# Patient Record
Sex: Male | Born: 1975
Health system: Southern US, Community
[De-identification: ages and names within clinical notes are randomized; demographics above are authoritative.]

## PROBLEM LIST (undated history)

## (undated) HISTORY — PX: KNEE SURGERY: SHX244

## (undated) HISTORY — PX: CHOLECYSTECTOMY: SHX55

---

## 2009-10-16 ENCOUNTER — Encounter: Admission: RE | Admit: 2009-10-16 | Discharge: 2009-10-16 | Payer: Self-pay | Admitting: Neurology

## 2009-10-23 ENCOUNTER — Encounter: Payer: Self-pay | Admitting: Endocrinology

## 2009-11-07 ENCOUNTER — Encounter: Payer: Self-pay | Admitting: Endocrinology

## 2010-04-29 ENCOUNTER — Encounter: Payer: Self-pay | Admitting: Neurology

## 2010-05-08 NOTE — Letter (Signed)
Summary: [facility]  [facility]   Imported By: Lester Rockwell 11/16/2009 07:25:19  _____________________________________________________________________  External Attachment:    Type:   Image     Comment:   External Document

## 2010-12-20 ENCOUNTER — Other Ambulatory Visit: Payer: Self-pay | Admitting: Family Medicine

## 2010-12-20 DIAGNOSIS — B360 Pityriasis versicolor: Secondary | ICD-10-CM

## 2010-12-20 MED ORDER — KETOCONAZOLE 200 MG PO TABS: ORAL_TABLET | ORAL | Status: DC

## 2012-08-07 ENCOUNTER — Other Ambulatory Visit: Payer: Self-pay | Admitting: Neurology

## 2012-08-07 DIAGNOSIS — G930 Cerebral cysts: Secondary | ICD-10-CM

## 2012-08-07 DIAGNOSIS — G35 Multiple sclerosis: Secondary | ICD-10-CM

## 2012-08-17 ENCOUNTER — Ambulatory Visit
Admission: RE | Admit: 2012-08-17 | Discharge: 2012-08-17 | Disposition: A | Discharge status: Home / Self Care | Payer: No Typology Code available for payment source | Source: Ambulatory Visit | Attending: Neurology | Admitting: Neurology

## 2012-08-17 DIAGNOSIS — G930 Cerebral cysts: Secondary | ICD-10-CM

## 2012-08-17 DIAGNOSIS — G35 Multiple sclerosis: Secondary | ICD-10-CM

## 2012-08-17 DIAGNOSIS — G35D Multiple sclerosis, unspecified: Secondary | ICD-10-CM

## 2012-08-17 MED ORDER — GADOBENATE DIMEGLUMINE 529 MG/ML IV SOLN
17.0000 mL | Freq: Once | INTRAVENOUS | Status: AC | PRN
  Administered 2012-08-17: 17 mL via INTRAVENOUS

## 2015-06-26 ENCOUNTER — Other Ambulatory Visit: Payer: Self-pay | Admitting: Family Medicine

## 2015-06-26 DIAGNOSIS — B360 Pityriasis versicolor: Secondary | ICD-10-CM

## 2015-06-26 MED ORDER — KETOCONAZOLE 200 MG PO TABS: ORAL_TABLET | ORAL | Status: DC

## 2016-05-27 ENCOUNTER — Other Ambulatory Visit: Payer: Self-pay | Admitting: Family Medicine

## 2016-05-27 DIAGNOSIS — B001 Herpesviral vesicular dermatitis: Secondary | ICD-10-CM

## 2016-05-27 MED ORDER — VALACYCLOVIR HCL 1 G PO TABS: 1000.0000 mg | ORAL_TABLET | Freq: Three times a day (TID) | ORAL | 5 refills | Status: DC

## 2016-10-15 ENCOUNTER — Other Ambulatory Visit: Payer: Self-pay | Admitting: Family Medicine

## 2016-10-15 DIAGNOSIS — B360 Pityriasis versicolor: Secondary | ICD-10-CM

## 2016-10-15 MED ORDER — KETOCONAZOLE 200 MG PO TABS
ORAL_TABLET | ORAL | 2 refills | Status: DC
Start: 2016-10-15 — End: 2017-05-20

## 2017-02-20 ENCOUNTER — Ambulatory Visit: Payer: PRIVATE HEALTH INSURANCE | Admitting: Family Medicine

## 2017-02-20 ENCOUNTER — Encounter: Payer: Self-pay | Admitting: Family Medicine

## 2017-02-20 DIAGNOSIS — R1084 Generalized abdominal pain: Secondary | ICD-10-CM

## 2017-02-20 NOTE — Patient Instructions (Signed)

## 2017-02-20 NOTE — Progress Notes (Signed)
Patient ID: Christopher Santana, male    DOB: 1975/07/10  Age: 41 y.o. MRN: [id number]    Subjective:  Subjective  HPI Christopher Santana presents for abd pain.  He was concerned about AP but pain has improved since a week ago.  He started taking psyllium about 1 week ago a has had several bms--- pt pain is almost completely gone.    Review of Systems  Constitutional: Negative for appetite change, diaphoresis, fatigue and unexpected weight change.  Eyes: Negative for pain, redness and visual disturbance.  Respiratory: Negative for cough, chest tightness, shortness of breath and wheezing.   Cardiovascular: Negative for chest pain, palpitations and leg swelling.  Endocrine: Negative for cold intolerance, heat intolerance, polydipsia, polyphagia and polyuria.  Genitourinary: Negative for difficulty urinating, dysuria and frequency.  Neurological: Negative for dizziness, light-headedness, numbness and headaches.    History History reviewed. No pertinent past medical history.  He has a past surgical history that includes Cholecystectomy.   His family history is not on file.He has no tobacco, alcohol, and drug history on file.  Current Outpatient Medications on File Prior to Visit  Medication Sig Dispense Refill  . ketoconazole (NIZORAL) 200 MG tablet As directed (Patient not taking: Reported on 02/20/2017) 7 tablet 2  . valACYclovir (VALTREX) 1000 MG tablet Take 1 tablet (1,000 mg total) by mouth 3 (three) times daily. (Patient not taking: Reported on 02/20/2017) 30 tablet 5   No current facility-administered medications on file prior to visit.      Objective:  Objective  Physical Exam  Constitutional: He is oriented to person, place, and time. Vital signs are normal. He appears well-developed and well-nourished. He is sleeping.  HENT:  Head: Normocephalic and atraumatic.  Mouth/Throat: Oropharynx is clear and moist.  Eyes: EOM are normal. Pupils are equal, round, and reactive to light.    Neck: Normal range of motion. Neck supple. No thyromegaly present.  Cardiovascular: Normal rate and regular rhythm.  No murmur heard. Pulmonary/Chest: Effort normal and breath sounds normal. No respiratory distress. He has no wheezes. He has no rales. He exhibits no tenderness.  Abdominal: Soft. Bowel sounds are normal. He exhibits no distension and no mass. There is no tenderness. There is no rebound and no guarding.  Musculoskeletal: He exhibits no edema or tenderness.  Neurological: He is alert and oriented to person, place, and time.  Skin: Skin is warm and dry.  Psychiatric: He has a normal mood and affect. His behavior is normal. Judgment and thought content normal.  Nursing note and vitals reviewed.  BP 132/87 (BP Location: Left Arm, Cuff Size: Large)   Pulse 63   Temp 97.9 F (36.6 C) (Oral)   Resp 16   Ht 5\' 10"  (1.778 m)   Wt 188 lb 6.4 oz (85.5 kg)   SpO2 98%   BMI 27.03 kg/m  Wt Readings from Last 3 Encounters:  02/20/17 188 lb 6.4 oz (85.5 kg)     No results found for: WBC, HGB, HCT, PLT, GLUCOSE, CHOL, TRIG, HDL, LDLDIRECT, LDLCALC, ALT, AST, NA, K, CL, CREATININE, BUN, CO2, TSH, PSA, INR, GLUF, HGBA1C, MICROALBUR  Mr Christopher Santana Wo Contrast  Result Date: 08/18/2012 *RADIOLOGY REPORT* Clinical Data: Multiple sclerosis. Arachnoid cysts. MRI HEAD WITHOUT AND WITH CONTRAST Technique:  Multiplanar, multiecho pulse sequences of the brain and surrounding structures were obtained according to standard protocol without and with intravenous contrast Contrast: 40mL MULTIHANCE GADOBENATE DIMEGLUMINE 529 MG/ML IV SOLN Comparison: MRI of the brain without and with contrast 10/16/2009.  Findings: A prominent arachnoid cyst of the left middle cranial fossa is stable.  An arachnoid cyst versus magnus cisterna magna is also stable.  Bilateral periventricular white matter lesions extend to the callosal septal margin.  There is no significant change. There is no enhancement or restricted  diffusion associated with these lesions.  Flow is present in the major intracranial arteries. Globes and orbits are intact.  Mild mucosal thickening is present in the ethmoid air cells bilaterally.  Polyps or mucous retention cysts are again noted at the floor of the left maxillary sinus. The postcontrast images demonstrate no pathologic enhancement. IMPRESSION: 1.  The no acute intracranial abnormality or significant interval change. 2.  Stable periventricular white matter lesions, compatible with a given diagnosis of multiple sclerosis. 3.  Stable left middle cranial fossa arachnoid cyst. 4.  Stable posterior fossa arachnoid cyst versus mega cisterna magna. 5.  Mild sinus disease. Original Report Authenticated By: Christopher Santana, M.D.     Assessment & Plan:  Plan  I am having Christopher RavensGeorge Santana maintain his valACYclovir and ketoconazole.  No orders of the defined types were placed in this encounter.   Problem List Items Addressed This Visit      Unprioritized   Abdominal pain    Resolved after BMs rto prn          Follow-up: Return if symptoms worsen or fail to improve.  Donato SchultzYvonne R Lowne Chase, DO

## 2017-02-23 ENCOUNTER — Encounter: Payer: Self-pay | Admitting: Family Medicine

## 2017-02-23 DIAGNOSIS — R109 Unspecified abdominal pain: Secondary | ICD-10-CM | POA: Insufficient documentation

## 2017-02-23 NOTE — Assessment & Plan Note (Signed)
Resolved after BMs rto prn

## 2017-03-04 ENCOUNTER — Other Ambulatory Visit: Payer: Self-pay | Admitting: Family Medicine

## 2017-03-04 DIAGNOSIS — R1084 Generalized abdominal pain: Secondary | ICD-10-CM

## 2017-03-07 ENCOUNTER — Ambulatory Visit: Payer: PRIVATE HEALTH INSURANCE | Admitting: Family Medicine

## 2017-03-07 ENCOUNTER — Ambulatory Visit (HOSPITAL_BASED_OUTPATIENT_CLINIC_OR_DEPARTMENT_OTHER)
Admission: RE | Admit: 2017-03-07 | Discharge: 2017-03-07 | Disposition: A | Discharge status: Home / Self Care | Payer: PRIVATE HEALTH INSURANCE | Source: Ambulatory Visit | Attending: Family Medicine | Admitting: Family Medicine

## 2017-03-07 ENCOUNTER — Other Ambulatory Visit: Payer: Self-pay

## 2017-03-07 ENCOUNTER — Encounter: Payer: Self-pay | Admitting: Family Medicine

## 2017-03-07 VITALS — BP 130/70 | HR 69 | Temp 98.0°F | Resp 16 | Ht 70.0 in | Wt 189.6 lb

## 2017-03-07 DIAGNOSIS — R1084 Generalized abdominal pain: Secondary | ICD-10-CM

## 2017-03-07 DIAGNOSIS — R1031 Right lower quadrant pain: Secondary | ICD-10-CM

## 2017-03-07 DIAGNOSIS — R109 Unspecified abdominal pain: Secondary | ICD-10-CM

## 2017-03-07 DIAGNOSIS — R1030 Lower abdominal pain, unspecified: Secondary | ICD-10-CM

## 2017-03-07 DIAGNOSIS — R609 Edema, unspecified: Secondary | ICD-10-CM | POA: Insufficient documentation

## 2017-03-07 LAB — POC URINALSYSI DIPSTICK (AUTOMATED)
BILIRUBIN UA: NEGATIVE
GLUCOSE UA: NEGATIVE
KETONES UA: NEGATIVE
Leukocytes, UA: NEGATIVE
Nitrite, UA: NEGATIVE
Protein, UA: NEGATIVE
RBC UA: NEGATIVE
UROBILINOGEN UA: 0.2 U/dL
pH, UA: 6 (ref 5.0–8.0)

## 2017-03-07 LAB — COMPREHENSIVE METABOLIC PANEL
ALT: 25 U/L (ref 0–53)
AST: 27 U/L (ref 0–37)
Albumin: 4.4 g/dL (ref 3.5–5.2)
Alkaline Phosphatase: 34 U/L — ABNORMAL LOW (ref 39–117)
BILIRUBIN TOTAL: 0.5 mg/dL (ref 0.2–1.2)
BUN: 13 mg/dL (ref 6–23)
CALCIUM: 9.6 mg/dL (ref 8.4–10.5)
CO2: 28 meq/L (ref 19–32)
CREATININE: 1.12 mg/dL (ref 0.40–1.50)
Chloride: 106 mEq/L (ref 96–112)
GFR: 75.95 mL/min (ref >60.00)
Glucose, Bld: 99 mg/dL (ref 70–99)
Potassium: 4.1 mEq/L (ref 3.5–5.1)
SODIUM: 142 meq/L (ref 135–145)
Total Protein: 7.4 g/dL (ref 6.0–8.3)

## 2017-03-07 LAB — CBC WITH DIFFERENTIAL/PLATELET
BASOS ABS: 0 10*3/uL (ref 0.0–0.1)
Basophils Relative: 1.3 % (ref 0.0–3.0)
EOS ABS: 0.2 10*3/uL (ref 0.0–0.7)
Eosinophils Relative: 4.7 % (ref 0.0–5.0)
HEMATOCRIT: 40.4 % (ref 39.0–52.0)
HEMOGLOBIN: 12.8 g/dL — AB (ref 13.0–17.0)
LYMPHS PCT: 42 % (ref 12.0–46.0)
Lymphs Abs: 1.7 10*3/uL (ref 0.7–4.0)
MCHC: 31.8 g/dL (ref 30.0–36.0)
MCV: 81.8 fl (ref 78.0–100.0)
MONO ABS: 0.3 10*3/uL (ref 0.1–1.0)
Monocytes Relative: 8.5 % (ref 3.0–12.0)
Neutro Abs: 1.7 10*3/uL (ref 1.4–7.7)
Neutrophils Relative %: 43.5 % (ref 43.0–77.0)
PLATELETS: 260 10*3/uL (ref 150.0–400.0)
RBC: 4.94 Mil/uL (ref 4.22–5.81)
RDW: 14.9 % (ref 11.5–15.5)
WBC: 4 10*3/uL (ref 4.0–10.5)

## 2017-03-07 MED ORDER — IOPAMIDOL (ISOVUE-300) INJECTION 61%
100.0000 mL | Freq: Once | INTRAVENOUS | Status: AC | PRN
  Administered 2017-03-07: 100 mL via INTRAVENOUS

## 2017-03-07 NOTE — Progress Notes (Signed)
Patient ID: Christopher Santana, male    DOB: 06-Jun-1975  Age: 41 y.o. MRN: [id number]    Subjective:  Subjective  HPI Christopher Santana presents for worsening rlq pain over the last several days.  He was double over in pain yesterday.  No constipation, no nvd.  No fever.  Pt also c/o R Upper rib pain with deep breath which makes abd pain worse as well.  Pt has pain with walking , and has to lay flat for any relief.  Any movement hurts.    Review of Systems  Constitutional: Negative for appetite change, diaphoresis, fatigue and unexpected weight change.  Eyes: Negative for pain, redness and visual disturbance.  Respiratory: Negative for cough, chest tightness, shortness of breath and wheezing.   Cardiovascular: Negative for chest pain, palpitations and leg swelling.  Gastrointestinal: Positive for abdominal distention and abdominal pain. Negative for constipation, diarrhea, nausea, rectal pain and vomiting.  Endocrine: Negative for cold intolerance, heat intolerance, polydipsia, polyphagia and polyuria.  Genitourinary: Negative for difficulty urinating, dysuria and frequency.  Neurological: Negative for dizziness, light-headedness, numbness and headaches.    History No past medical history on file.  He has a past surgical history that includes Cholecystectomy and Knee surgery (Left).   His family history is not on file.He reports that  has never smoked. he has never used smokeless tobacco. He reports that he drinks alcohol. He reports that he does not use drugs.  Current Outpatient Medications on File Prior to Visit  Medication Sig Dispense Refill  . ketoconazole (NIZORAL) 200 MG tablet As directed (Patient not taking: Reported on 02/20/2017) 7 tablet 2  . valACYclovir (VALTREX) 1000 MG tablet Take 1 tablet (1,000 mg total) by mouth 3 (three) times daily. (Patient not taking: Reported on 02/20/2017) 30 tablet 5   No current facility-administered medications on file prior to visit.        Objective:  Objective  Physical Exam  Constitutional: He is oriented to person, place, and time. Vital signs are normal. He appears well-developed and well-nourished. He is sleeping.  HENT:  Head: Normocephalic and atraumatic.  Mouth/Throat: Oropharynx is clear and moist.  Eyes: EOM are normal. Pupils are equal, round, and reactive to light.  Neck: Normal range of motion. Neck supple. No thyromegaly present.  Cardiovascular: Normal rate and regular rhythm.  No murmur heard. Pulmonary/Chest: Effort normal and breath sounds normal. No respiratory distress. He has no wheezes. He has no rales. He exhibits tenderness. He exhibits no crepitus.    Abdominal: He exhibits no mass. There is tenderness in the right lower quadrant. There is guarding. There is no rebound.    Musculoskeletal: He exhibits no edema or tenderness.  Neurological: He is alert and oriented to person, place, and time.  Skin: Skin is warm and dry.  Psychiatric: He has a normal mood and affect. His behavior is normal. Judgment and thought content normal.  Nursing note and vitals reviewed.  BP 130/70 (BP Location: Left Arm, Cuff Size: Normal)   Pulse 69   Temp 98 F (36.7 C) (Oral)   Resp 16   Ht 5\' 10"  (1.778 m)   Wt 189 lb 9.6 oz (86 kg)   SpO2 97%   BMI 27.20 kg/m  Wt Readings from Last 3 Encounters:  03/07/17 189 lb 9.6 oz (86 kg)  02/20/17 188 lb 6.4 oz (85.5 kg)     No results found for: WBC, HGB, HCT, PLT, GLUCOSE, CHOL, TRIG, HDL, LDLDIRECT, LDLCALC, ALT, AST, NA, K, CL, CREATININE,  BUN, CO2, TSH, PSA, INR, GLUF, Christopher Santana  Mr Christopher Santana Contrast  Result Date: 08/18/2012 *RADIOLOGY REPORT* Clinical Data: Multiple sclerosis. Arachnoid cysts. MRI HEAD WITHOUT AND WITH CONTRAST Technique:  Multiplanar, multiecho pulse sequences of the brain and surrounding structures were obtained according to standard protocol without and with intravenous contrast Contrast: 62mL MULTIHANCE GADOBENATE DIMEGLUMINE  529 MG/ML IV SOLN Comparison: MRI of the brain without and with contrast 10/16/2009. Findings: A prominent arachnoid cyst of the left middle cranial fossa is stable.  An arachnoid cyst versus magnus cisterna magna is also stable.  Bilateral periventricular white matter lesions extend to the callosal septal margin.  There is no significant change. There is no enhancement or restricted diffusion associated with these lesions.  Flow is present in the major intracranial arteries. Globes and orbits are intact.  Mild mucosal thickening is present in the ethmoid air cells bilaterally.  Polyps or mucous retention cysts are again noted at the floor of the left maxillary sinus. The postcontrast images demonstrate no pathologic enhancement. IMPRESSION: 1.  The no acute intracranial abnormality or significant interval change. 2.  Stable periventricular white matter lesions, compatible with a given diagnosis of multiple sclerosis. 3.  Stable left middle cranial fossa arachnoid cyst. 4.  Stable posterior fossa arachnoid cyst versus mega cisterna magna. 5.  Mild sinus disease. Original Report Authenticated By: Marin Roberts, M.D.     Assessment & Plan:  Plan  I am having Christopher Santana maintain his valACYclovir and ketoconazole.  No orders of the defined types were placed in this encounter.   Problem List Items Addressed This Visit      Unprioritized   Abdominal pain - Primary   Relevant Orders   CBC with Differential/Platelet   Comprehensive metabolic panel   POCT Urinalysis Dipstick (Automated)   DG Ribs Unilateral W/Chest Right   CT Abdomen Pelvis W Contrast    labs ordered Ct ordered to r/o appendicitis ---- pt being sent down to ct Pt to go to ER if pain worsens  Follow-up: Return if symptoms worsen or fail to improve.  Donato Schultz, DO

## 2017-03-07 NOTE — Progress Notes (Deleted)
Patient ID: Christopher Santana, male   DOB: September 27, 1975, 41 y.o.   MRN: [id number]     Subjective:  {Insert Teamcare-CMA Attestation HERE}  Patient ID: Christopher Santana, male    DOB: 11-Jul-1975, 41 y.o.   MRN: [id number]  Chief Complaint  Patient presents with  . Abdominal Pain    HPI  Patient is in today for abdominal pain.  Patient Care Team: Patient, No Pcp Per as PCP - General (General Practice)   No past medical history on file.  Past Surgical History:  Procedure Laterality Date  . CHOLECYSTECTOMY      No family history on file.  Social History   Socioeconomic History  . Marital status: Married    Spouse name: Not on file  . Number of children: Not on file  . Years of education: Not on file  . Highest education level: Not on file  Social Needs  . Financial resource strain: Not on file  . Food insecurity - worry: Not on file  . Food insecurity - inability: Not on file  . Transportation needs - medical: Not on file  . Transportation needs - non-medical: Not on file  Occupational History  . Not on file  Tobacco Use  . Smoking status: Never Smoker  . Smokeless tobacco: Never Used  Substance and Sexual Activity  . Alcohol use: Yes  . Drug use: No  . Sexual activity: Yes    Partners: Female  Other Topics Concern  . Not on file  Social History Narrative  . Not on file    Outpatient Medications Prior to Visit  Medication Sig Dispense Refill  . ketoconazole (NIZORAL) 200 MG tablet As directed (Patient not taking: Reported on 02/20/2017) 7 tablet 2  . valACYclovir (VALTREX) 1000 MG tablet Take 1 tablet (1,000 mg total) by mouth 3 (three) times daily. (Patient not taking: Reported on 02/20/2017) 30 tablet 5   No facility-administered medications prior to visit.     Not on File  Review of Systems  Constitutional: Negative for fever and malaise/fatigue.  HENT: Negative for congestion.   Eyes: Negative for blurred vision.  Respiratory: Negative for cough and  shortness of breath.   Cardiovascular: Negative for chest pain, palpitations and leg swelling.  Gastrointestinal: Positive for abdominal pain (whole right side). Negative for nausea and vomiting.  Musculoskeletal: Negative for back pain.  Skin: Negative for rash.  Neurological: Negative for loss of consciousness and headaches.       Objective:    Physical Exam  BP 130/70 (BP Location: Left Arm, Cuff Size: Normal)   Pulse 69   Temp 98 F (36.7 C) (Oral)   Resp 16   Ht 5' 10"  (1.778 m)   Wt 189 lb 9.6 oz (86 kg)   SpO2 97%   BMI 27.20 kg/m  Wt Readings from Last 3 Encounters:  03/07/17 189 lb 9.6 oz (86 kg)  02/20/17 188 lb 6.4 oz (85.5 kg)   BP Readings from Last 3 Encounters:  03/07/17 130/70  02/20/17 132/87      There is no immunization history on file for this patient.  Health Maintenance  Topic Date Due  . HIV Screening  11/20/1988  . TETANUS/TDAP  11/20/1992  . INFLUENZA VACCINE  11/06/2016    No results found for: WBC, HGB, HCT, PLT, GLUCOSE, CHOL, TRIG, HDL, LDLDIRECT, LDLCALC, ALT, AST, NA, K, CL, CREATININE, BUN, CO2, TSH, PSA, INR, GLUF, HGBA1C, MICROALBUR  No results found for: TSH No results found for: WBC, HGB, HCT,  MCV, PLT No results found for: NA, K, CHLORIDE, CO2, GLUCOSE, BUN, CREATININE, BILITOT, ALKPHOS, AST, ALT, PROT, ALBUMIN, CALCIUM, ANIONGAP, EGFR, GFR No results found for: CHOL No results found for: HDL No results found for: LDLCALC No results found for: TRIG No results found for: CHOLHDL No results found for: HGBA1C       Assessment & Plan:   Problem List Items Addressed This Visit    None      I am having Christopher Santana maintain his valACYclovir and ketoconazole.  No orders of the defined types were placed in this encounter.   {PROVIDER TO DELETE} Jerene Dilling, CMA

## 2017-03-07 NOTE — Patient Instructions (Signed)

## 2017-03-10 ENCOUNTER — Encounter: Payer: Self-pay | Admitting: *Deleted

## 2017-04-23 ENCOUNTER — Ambulatory Visit (HOSPITAL_COMMUNITY)
Admission: RE | Admit: 2017-04-23 | Discharge: 2017-04-23 | Disposition: A | Discharge status: Home / Self Care | Payer: PRIVATE HEALTH INSURANCE | Source: Ambulatory Visit | Attending: Family Medicine | Admitting: Family Medicine

## 2017-04-23 DIAGNOSIS — R109 Unspecified abdominal pain: Secondary | ICD-10-CM | POA: Insufficient documentation

## 2017-04-23 DIAGNOSIS — Z9049 Acquired absence of other specified parts of digestive tract: Secondary | ICD-10-CM | POA: Diagnosis not present

## 2017-04-23 MED ORDER — GADOBENATE DIMEGLUMINE 529 MG/ML IV SOLN
20.0000 mL | Freq: Once | INTRAVENOUS | Status: AC | PRN
  Administered 2017-04-23: 18 mL via INTRAVENOUS

## 2017-04-26 ENCOUNTER — Ambulatory Visit (HOSPITAL_BASED_OUTPATIENT_CLINIC_OR_DEPARTMENT_OTHER): Payer: PRIVATE HEALTH INSURANCE

## 2017-04-29 NOTE — Progress Notes (Signed)
Results given to patient over the phone 

## 2017-05-19 ENCOUNTER — Encounter: Payer: Self-pay | Admitting: Family Medicine

## 2017-05-19 MED ORDER — CEPHALEXIN 500 MG PO CAPS: 500.0000 mg | ORAL_CAPSULE | Freq: Two times a day (BID) | ORAL | 0 refills | Status: DC

## 2017-05-19 NOTE — Telephone Encounter (Signed)
Infected--- keflex 500 mg bid x 10 days  Ov later this week  Can use neosporin also topically Looks like he needs tdap as well

## 2017-05-19 NOTE — Telephone Encounter (Signed)
Looks infected ----- keflex 500 mg bidx 10 days Can use neosporin topically F/u later this week

## 2017-05-20 ENCOUNTER — Encounter: Payer: Self-pay | Admitting: Family Medicine

## 2017-05-20 ENCOUNTER — Ambulatory Visit: Payer: PRIVATE HEALTH INSURANCE | Admitting: Family Medicine

## 2017-05-20 VITALS — BP 138/88 | HR 64 | Temp 97.6°F | Resp 16 | Ht 70.0 in | Wt 198.4 lb

## 2017-05-20 DIAGNOSIS — L03115 Cellulitis of right lower limb: Secondary | ICD-10-CM

## 2017-05-20 DIAGNOSIS — Z23 Encounter for immunization: Secondary | ICD-10-CM | POA: Diagnosis not present

## 2017-05-20 DIAGNOSIS — L02415 Cutaneous abscess of right lower limb: Secondary | ICD-10-CM | POA: Diagnosis not present

## 2017-05-20 NOTE — Progress Notes (Signed)
Subjective:    Patient ID: Christopher Santana, male    DOB: May 13, 1975, 42 y.o.   MRN: [id number]  Chief Complaint  Patient presents with  . Wound Check    HPI Patient is in today for c/o wound R shin -- [place] last week.  It has gotten swollen and red.  No calf pain.  Pt started abx yesterday.  See my chart message.    No past medical history on file.  Past Surgical History:  Procedure Laterality Date  . CHOLECYSTECTOMY    . KNEE SURGERY Left    around 2011    No family history on file.  Social History   Socioeconomic History  . Marital status: Married    Spouse name: Not on file  . Number of children: Not on file  . Years of education: Not on file  . Highest education level: Not on file  Social Needs  . Financial resource strain: Not on file  . Food insecurity - worry: Not on file  . Food insecurity - inability: Not on file  . Transportation needs - medical: Not on file  . Transportation needs - non-medical: Not on file  Occupational History  . Not on file  Tobacco Use  . Smoking status: Never Smoker  . Smokeless tobacco: Never Used  Substance and Sexual Activity  . Alcohol use: Yes  . Drug use: No  . Sexual activity: Yes    Partners: Female  Other Topics Concern  . Not on file  Social History Narrative  . Not on file    Outpatient Medications Prior to Visit  Medication Sig Dispense Refill  . cephALEXin (KEFLEX) 500 MG capsule Take 1 capsule (500 mg total) by mouth 2 (two) times daily. For 10 days 20 capsule 0  . ketoconazole (NIZORAL) 200 MG tablet As directed (Patient not taking: Reported on 02/20/2017) 7 tablet 2  . valACYclovir (VALTREX) 1000 MG tablet Take 1 tablet (1,000 mg total) by mouth 3 (three) times daily. (Patient not taking: Reported on 02/20/2017) 30 tablet 5   No facility-administered medications prior to visit.     Not on File  Review of Systems  Constitutional: Negative for chills, fever and malaise/fatigue.    HENT: Negative for congestion and hearing loss.   Eyes: Negative for discharge.  Respiratory: Negative for cough, sputum production and shortness of breath.   Cardiovascular: Negative for chest pain, palpitations and leg swelling.  Gastrointestinal: Negative for abdominal pain, blood in stool, constipation, diarrhea, heartburn, nausea and vomiting.  Genitourinary: Negative for dysuria, frequency, hematuria and urgency.  Musculoskeletal: Negative for back pain, falls and myalgias.  Skin: Negative for rash.  Neurological: Negative for dizziness, sensory change, loss of consciousness, weakness and headaches.  Endo/Heme/Allergies: Negative for environmental allergies. Does not bruise/bleed easily.  Psychiatric/Behavioral: Negative for depression and suicidal ideas. The patient is not nervous/anxious and does not have insomnia.        Objective:    Physical Exam  Skin: There is erythema.     Nursing note and vitals reviewed.   BP 138/88 (BP Location: Left Arm, Cuff Size: Normal)   Pulse 64   Temp 97.6 F (36.4 C) (Oral)   Resp 16   Ht 5\' 10"  (1.778 m)   Wt 198 lb 6.4 oz (90 kg)   SpO2 97%   BMI 28.47 kg/m  Wt Readings from Last 3 Encounters:  05/20/17 198 lb 6.4 oz (90 kg)  04/23/17 189 lb (  85.7 kg)  03/07/17 189 lb 9.6 oz (86 kg)     Lab Results  Component Value Date   WBC 4.0 03/07/2017   HGB 12.8 (L) 03/07/2017   HCT 40.4 03/07/2017   PLT 260.0 03/07/2017   GLUCOSE 99 03/07/2017   ALT 25 03/07/2017   AST 27 03/07/2017   NA 142 03/07/2017   K 4.1 03/07/2017   CL 106 03/07/2017   CREATININE 1.12 03/07/2017   BUN 13 03/07/2017   CO2 28 03/07/2017    No results found for: TSH Lab Results  Component Value Date   WBC 4.0 03/07/2017   HGB 12.8 (L) 03/07/2017   HCT 40.4 03/07/2017   MCV 81.8 03/07/2017   PLT 260.0 03/07/2017   Lab Results  Component Value Date   NA 142 03/07/2017   K 4.1 03/07/2017   CO2 28 03/07/2017   GLUCOSE 99 03/07/2017   BUN 13  03/07/2017   CREATININE 1.12 03/07/2017   BILITOT 0.5 03/07/2017   ALKPHOS 34 (L) 03/07/2017   AST 27 03/07/2017   ALT 25 03/07/2017   PROT 7.4 03/07/2017   ALBUMIN 4.4 03/07/2017   CALCIUM 9.6 03/07/2017   GFR 75.95 03/07/2017   No results found for: CHOL No results found for: HDL No results found for: LDLCALC No results found for: TRIG No results found for: CHOLHDL No results found for: [id number]     Assessment & Plan:   Problem List Items Addressed This Visit      Unprioritized   Cellulitis and abscess of right leg    Finish keflex Pt states it has improved over night Keep elevated tdap given today F/u prn       Other Visit Diagnoses    Need for Tdap vaccination    -  Primary   Relevant Orders   Tdap vaccine greater than or equal to 7yo IM (Completed)      I have discontinued Greggory Stallion Olden's valACYclovir and ketoconazole. I am also having him maintain his cephALEXin.  No orders of the defined types were placed in this encounter.  CMA served as Neurosurgeon during this visit. History, Physical and Plan performed by medical provider. Documentation and orders reviewed and attested to.   Donato Schultz, DO

## 2017-05-20 NOTE — Patient Instructions (Signed)

## 2017-05-20 NOTE — Assessment & Plan Note (Signed)
Finish keflex Pt states it has improved over night Keep elevated tdap given today F/u prn

## 2017-05-27 ENCOUNTER — Encounter: Payer: Self-pay | Admitting: Family Medicine

## 2017-08-21 ENCOUNTER — Telehealth: Payer: Self-pay | Admitting: Family Medicine

## 2017-08-21 ENCOUNTER — Other Ambulatory Visit: Payer: Self-pay | Admitting: Family Medicine

## 2017-08-21 DIAGNOSIS — M25511 Pain in right shoulder: Principal | ICD-10-CM

## 2017-08-21 DIAGNOSIS — G8929 Other chronic pain: Secondary | ICD-10-CM

## 2017-08-21 MED ORDER — PREDNISONE 10 MG PO TABS: ORAL_TABLET | ORAL | 0 refills | Status: DC

## 2017-08-21 MED ORDER — CYCLOBENZAPRINE HCL 10 MG PO TABS: 10.0000 mg | ORAL_TABLET | Freq: Three times a day (TID) | ORAL | 0 refills | Status: DC | PRN

## 2017-08-21 NOTE — Telephone Encounter (Signed)
Pt is having shoulder pain---  Had an injection for bursitis with Dr Ethelene Hal last year for bursitis-- it helped a little If that did not work he was going to inject for tendinitis --- he leaves for [place] soon and will not be able to get into [place] Wanting something to help since he is leaving town Will call in pred taper and flexeril for him and he may see Dr Ethelene Hal when he gets back if no better

## 2017-08-28 ENCOUNTER — Other Ambulatory Visit: Payer: Self-pay | Admitting: Family Medicine

## 2017-08-28 DIAGNOSIS — E538 Deficiency of other specified B group vitamins: Secondary | ICD-10-CM

## 2017-08-28 MED ORDER — CYANOCOBALAMIN 1000 MCG/ML IJ SOLN: 1000.0000 ug | INTRAMUSCULAR | 3 refills | Status: DC

## 2017-09-26 ENCOUNTER — Ambulatory Visit: Payer: No Typology Code available for payment source | Admitting: Family Medicine

## 2017-09-26 ENCOUNTER — Encounter: Payer: Self-pay | Admitting: Family Medicine

## 2017-09-26 VITALS — BP 118/76 | HR 80 | Temp 98.3°F | Ht 69.0 in | Wt 187.4 lb

## 2017-09-26 DIAGNOSIS — M25511 Pain in right shoulder: Secondary | ICD-10-CM

## 2017-09-26 DIAGNOSIS — G8929 Other chronic pain: Secondary | ICD-10-CM | POA: Diagnosis not present

## 2017-09-26 MED ORDER — METHYLPREDNISOLONE ACETATE 80 MG/ML IJ SUSP
80.0000 mg | Freq: Once | INTRAMUSCULAR | Status: AC
  Administered 2017-09-26: 80 mg via INTRAMUSCULAR

## 2017-09-26 NOTE — Progress Notes (Addendum)
Chief Complaint  Patient presents with  . Shoulder Pain    right    Christopher Santana is a 42 yo male here for a shoulder inj. Has been dealing with some chronic issues on R shoulder. Pred rx'd by pcp really helped. Today is a good day, but has issues with shoulder much of time.   Gen- awake, alert MSK- no ttp, +Neer's, Hawkins, emptycan Psych- age appropriate judgment and insight  Procedure Note; Shoulder bursa injection Informed consent obtained. The area was palpated, an area was marked just caudal to the acromion process laterally, and cleaned with alcohol x1. A 27-gauge needle was used to enter the joint laterally with ease. 80 mg of Depomedrol with 2 mL of 1% lidocaine was injected. The patient tolerated the procedure well. There were no complications noted.  Chronic right shoulder pain - Plan: PR DRAIN/INJECT LARGE JOINT/BURSA  Discussed follow up care.  If this does not help, might need to consider MRI vs seeing ortho again. F/u as originally scheduled with pcp. Pt voiced understanding and agreement to the plan.  Jilda Roche Malaquias Lenker 1:04 PM 09/26/17

## 2017-09-26 NOTE — Addendum Note (Signed)
Addended by: Scharlene Gloss B on: 09/26/2017 01:21 PM   Modules accepted: Orders

## 2018-10-01 ENCOUNTER — Telehealth: Payer: Self-pay | Admitting: Family Medicine

## 2018-10-01 ENCOUNTER — Telehealth: Payer: Self-pay | Admitting: *Deleted

## 2018-10-01 DIAGNOSIS — Z20822 Contact with and (suspected) exposure to covid-19: Secondary | ICD-10-CM

## 2018-10-01 NOTE — Telephone Encounter (Signed)
Pt scheduled for covid testing on 10/02/18 @ 12:15pm. Instructions given and order placed.

## 2018-10-01 NOTE — Telephone Encounter (Signed)
Pt was exposed to +covid and needs testing--- no symptoms

## 2018-10-02 ENCOUNTER — Other Ambulatory Visit: Payer: Self-pay

## 2018-10-02 DIAGNOSIS — Z20822 Contact with and (suspected) exposure to covid-19: Secondary | ICD-10-CM

## 2018-10-08 LAB — NOVEL CORONAVIRUS, NAA: SARS-CoV-2, NAA: NOT DETECTED

## 2019-06-18 DIAGNOSIS — R5383 Other fatigue: Secondary | ICD-10-CM | POA: Diagnosis not present

## 2019-06-18 DIAGNOSIS — R519 Headache, unspecified: Secondary | ICD-10-CM | POA: Diagnosis not present

## 2019-06-18 DIAGNOSIS — Z6826 Body mass index (BMI) 26.0-26.9, adult: Secondary | ICD-10-CM | POA: Diagnosis not present

## 2019-06-24 ENCOUNTER — Ambulatory Visit: Payer: No Typology Code available for payment source

## 2019-07-07 ENCOUNTER — Other Ambulatory Visit: Payer: Self-pay

## 2019-07-08 ENCOUNTER — Ambulatory Visit: Payer: BC Managed Care – PPO | Admitting: Family Medicine

## 2019-07-08 ENCOUNTER — Other Ambulatory Visit: Payer: Self-pay

## 2019-07-08 ENCOUNTER — Encounter: Payer: Self-pay | Admitting: Family Medicine

## 2019-07-08 VITALS — BP 134/98 | HR 94 | Temp 97.0°F | Resp 18 | Ht 69.0 in | Wt 193.0 lb

## 2019-07-08 DIAGNOSIS — F430 Acute stress reaction: Secondary | ICD-10-CM

## 2019-07-08 DIAGNOSIS — M5030 Other cervical disc degeneration, unspecified cervical region: Secondary | ICD-10-CM | POA: Diagnosis not present

## 2019-07-08 DIAGNOSIS — G35 Multiple sclerosis: Secondary | ICD-10-CM | POA: Diagnosis not present

## 2019-07-08 DIAGNOSIS — R030 Elevated blood-pressure reading, without diagnosis of hypertension: Secondary | ICD-10-CM

## 2019-07-08 DIAGNOSIS — S80861A Insect bite (nonvenomous), right lower leg, initial encounter: Secondary | ICD-10-CM

## 2019-07-08 DIAGNOSIS — W57XXXA Bitten or stung by nonvenomous insect and other nonvenomous arthropods, initial encounter: Secondary | ICD-10-CM

## 2019-07-08 LAB — CBC WITH DIFFERENTIAL/PLATELET
Basophils Absolute: 0 10*3/uL (ref 0.0–0.1)
Basophils Relative: 1.1 % (ref 0.0–3.0)
Eosinophils Absolute: 0.1 10*3/uL (ref 0.0–0.7)
Eosinophils Relative: 2.7 % (ref 0.0–5.0)
HCT: 40.5 % (ref 39.0–52.0)
Hemoglobin: 13.2 g/dL (ref 13.0–17.0)
Lymphocytes Relative: 37.5 % (ref 12.0–46.0)
Lymphs Abs: 1.6 10*3/uL (ref 0.7–4.0)
MCHC: 32.6 g/dL (ref 30.0–36.0)
MCV: 79 fl (ref 78.0–100.0)
Monocytes Absolute: 0.3 10*3/uL (ref 0.1–1.0)
Monocytes Relative: 6.7 % (ref 3.0–12.0)
Neutro Abs: 2.2 10*3/uL (ref 1.4–7.7)
Neutrophils Relative %: 52 % (ref 43.0–77.0)
Platelets: 223 10*3/uL (ref 150.0–400.0)
RBC: 5.13 Mil/uL (ref 4.22–5.81)
RDW: 14.8 % (ref 11.5–15.5)
WBC: 4.2 10*3/uL (ref 4.0–10.5)

## 2019-07-08 LAB — COMPREHENSIVE METABOLIC PANEL
ALT: 39 U/L (ref 0–53)
AST: 31 U/L (ref 0–37)
Albumin: 4.7 g/dL (ref 3.5–5.2)
Alkaline Phosphatase: 41 U/L (ref 39–117)
BUN: 16 mg/dL (ref 6–23)
CO2: 28 mEq/L (ref 19–32)
Calcium: 9.5 mg/dL (ref 8.4–10.5)
Chloride: 103 mEq/L (ref 96–112)
Creatinine, Ser: 1.01 mg/dL (ref 0.40–1.50)
GFR: 79.66 mL/min (ref >60.00)
Glucose, Bld: 82 mg/dL (ref 70–99)
Potassium: 4.5 mEq/L (ref 3.5–5.1)
Sodium: 138 mEq/L (ref 135–145)
Total Bilirubin: 0.5 mg/dL (ref 0.2–1.2)
Total Protein: 7.2 g/dL (ref 6.0–8.3)

## 2019-07-08 LAB — VITAMIN B12: Vitamin B-12: 330 pg/mL (ref 211–911)

## 2019-07-08 LAB — TSH: TSH: 1.02 u[IU]/mL (ref 0.35–4.50)

## 2019-07-08 MED ORDER — PREDNISONE 10 MG PO TABS: ORAL_TABLET | ORAL | 0 refills | Status: DC

## 2019-07-08 MED ORDER — DOXYCYCLINE HYCLATE 100 MG PO TABS: 100.0000 mg | ORAL_TABLET | Freq: Two times a day (BID) | ORAL | 0 refills | Status: DC

## 2019-07-08 NOTE — Patient Instructions (Signed)

## 2019-07-08 NOTE — Progress Notes (Signed)
Patient ID: Christopher Santana, male    DOB: January 09, 1976  Age: 44 y.o. MRN: [id number]    Subjective:  Subjective  HPI Christopher Santana presents for tick bite behind R knee 2 weeks ago-- no pain but he has tingling in both legs    He was dx MS years ago but was in remission.  He is a little worried this is coming back.  He has not seen neuro since his last neuro retired a few years ago.    Review of Systems  Constitutional: Negative for appetite change, diaphoresis, fatigue and unexpected weight change.  Eyes: Negative for pain, redness and visual disturbance.  Respiratory: Negative for cough, chest tightness, shortness of breath and wheezing.   Cardiovascular: Negative for chest pain, palpitations and leg swelling.  Endocrine: Negative for cold intolerance, heat intolerance, polydipsia, polyphagia and polyuria.  Genitourinary: Negative for difficulty urinating, dysuria and frequency.  Neurological: Positive for numbness. Negative for dizziness, light-headedness and headaches.    History No past medical history on file.  He has a past surgical history that includes Cholecystectomy and Knee surgery (Left).   His family history is not on file.He reports that he has never smoked. He has never used smokeless tobacco. He reports current alcohol use. He reports that he does not use drugs.  No current outpatient medications on file prior to visit.   No current facility-administered medications on file prior to visit.     Objective:  Objective  Physical Exam Vitals and nursing note reviewed.  Constitutional:      General: He is sleeping.     Appearance: He is well-developed.  HENT:     Head: Normocephalic and atraumatic.  Eyes:     Pupils: Pupils are equal, round, and reactive to light.  Neck:     Thyroid: No thyromegaly.  Cardiovascular:     Rate and Rhythm: Normal rate and regular rhythm.     Heart sounds: No murmur.  Pulmonary:     Effort: Pulmonary effort is normal. No respiratory  distress.     Breath sounds: Normal breath sounds. No wheezing or rales.  Chest:     Chest wall: No tenderness.  Musculoskeletal:        General: No tenderness.     Cervical back: Normal range of motion and neck supple.  Skin:    General: Skin is warm and dry.  Neurological:     Mental Status: He is oriented to person, place, and time.  Psychiatric:        Behavior: Behavior normal.        Thought Content: Thought content normal.        Judgment: Judgment normal.    BP (!) 134/98   Pulse 94   Temp (!) 97 F (36.1 C) (Temporal)   Resp 18   Ht 5\' 9"  (1.753 m)   Wt 193 lb (87.5 kg)   SpO2 96%   BMI 28.50 kg/m  Wt Readings from Last 3 Encounters:  07/08/19 193 lb (87.5 kg)  09/26/17 187 lb 6 oz (85 kg)  05/20/17 198 lb 6.4 oz (90 kg)     Lab Results  Component Value Date   WBC 4.2 07/08/2019   HGB 13.2 07/08/2019   HCT 40.5 07/08/2019   PLT 223.0 07/08/2019   GLUCOSE 82 07/08/2019   ALT 39 07/08/2019   AST 31 07/08/2019   NA 138 07/08/2019   K 4.5 07/08/2019   CL 103 07/08/2019   CREATININE 1.01 07/08/2019  BUN 16 07/08/2019   CO2 28 07/08/2019   TSH 1.02 07/08/2019    MR LIVER W WO CONTRAST  Result Date: 04/23/2017 CLINICAL DATA:  Abdominal pain for several weeks. Prior cholecystectomy. Mild perihepatic edema and biliary dilatation on recent CT. EXAM: MRI ABDOMEN WITHOUT AND WITH CONTRAST TECHNIQUE: Multiplanar multisequence MR imaging of the abdomen was performed both before and after the administration of intravenous contrast. CONTRAST:  45mL MULTIHANCE GADOBENATE DIMEGLUMINE 529 MG/ML IV SOLN COMPARISON:  CT on 03/07/2017 FINDINGS: Lower chest: No acute findings. Hepatobiliary: No hepatic masses identified. Tiny sub-cm cyst noted in right hepatic lobe. No inflammatory changes or fluid collections seen adjacent to the right hepatic lobe. Prior cholecystectomy noted. Common bile duct measures 9 mm, which is within normal limits status post cholecystectomy.  Pancreas:  No mass or inflammatory changes. Spleen:  Within normal limits in size and appearance. Adrenals/Urinary Tract: No masses identified. No evidence of hydronephrosis. Stomach/Bowel: Visualized abdominal bowel unremarkable. Vascular/Lymphatic: No pathologically enlarged lymph nodes identified. No abdominal aortic aneurysm. Other:  None. Musculoskeletal:  No suspicious bone lesions identified. IMPRESSION: Prior cholecystectomy. No evidence of biliary obstruction or other acute findings. Electronically Signed   By: Myles Rosenthal M.D.   On: 04/23/2017 20:31     Assessment & Plan:  Plan  I have discontinued Christopher Santana's cephALEXin, predniSONE, cyclobenzaprine, and cyanocobalamin. I am also having him start on doxycycline and predniSONE.  Meds ordered this encounter  Medications  . doxycycline (VIBRA-TABS) 100 MG tablet    Sig: Take 1 tablet (100 mg total) by mouth 2 (two) times daily.    Dispense:  20 tablet    Refill:  0  . predniSONE (DELTASONE) 10 MG tablet    Sig: TAKE 3 TABLETS PO QD FOR 3 DAYS THEN TAKE 2 TABLETS PO QD FOR 3 DAYS THEN TAKE 1 TABLET PO QD FOR 3 DAYS THEN TAKE 1/2 TAB PO QD FOR 3 DAYS    Dispense:  20 tablet    Refill:  0    Problem List Items Addressed This Visit      Unprioritized   Elevated BP without diagnosis of hypertension    Dash diet Pt under a lot of stress Recheck 2-3 weeks       MS (multiple sclerosis) (HCC)    Pt did not want to go to neuro yet He wants to wait for Mri      Relevant Medications   predniSONE (DELTASONE) 10 MG tablet   Other Relevant Orders   CBC with Differential/Platelet (Completed)   Comprehensive metabolic panel (Completed)   TSH (Completed)   Vitamin B12 (Completed)   Tick bite of right lower leg - Primary    Check labs Doxy for 10 days      Relevant Medications   doxycycline (VIBRA-TABS) 100 MG tablet   Other Relevant Orders   CBC with Differential/Platelet (Completed)   Comprehensive metabolic panel  (Completed)   TSH (Completed)   Vitamin B12 (Completed)   B. burgdorfi antibodies (Completed)   Rocky mtn spotted fvr abs pnl(IgG+IgM) (Completed)    Other Visit Diagnoses    Multiple sclerosis (HCC)       Relevant Orders   MR Brain W Wo Contrast   Other cervical disc degeneration, unspecified cervical region       Relevant Medications   predniSONE (DELTASONE) 10 MG tablet   Other Relevant Orders   MR CERVICAL SPINE W WO CONTRAST   Stress reaction  Follow-up: Return if symptoms worsen or fail to improve.  Ann Held, DO

## 2019-07-12 ENCOUNTER — Telehealth: Payer: Self-pay | Admitting: Family Medicine

## 2019-07-12 DIAGNOSIS — R030 Elevated blood-pressure reading, without diagnosis of hypertension: Secondary | ICD-10-CM | POA: Insufficient documentation

## 2019-07-12 DIAGNOSIS — S80861A Insect bite (nonvenomous), right lower leg, initial encounter: Secondary | ICD-10-CM | POA: Insufficient documentation

## 2019-07-12 DIAGNOSIS — W57XXXA Bitten or stung by nonvenomous insect and other nonvenomous arthropods, initial encounter: Secondary | ICD-10-CM | POA: Insufficient documentation

## 2019-07-12 LAB — ROCKY MTN SPOTTED FVR ABS PNL(IGG+IGM)
RMSF IgG: NOT DETECTED
RMSF IgM: NOT DETECTED

## 2019-07-12 LAB — B. BURGDORFI ANTIBODIES: B burgdorferi Ab IgG+IgM: <0.9 index

## 2019-07-12 NOTE — Telephone Encounter (Signed)
Does this need a new referral?

## 2019-07-12 NOTE — Assessment & Plan Note (Signed)
Dash diet Pt under a lot of stress Recheck 2-3 weeks

## 2019-07-12 NOTE — Assessment & Plan Note (Signed)
Pt did not want to go to neuro yet He wants to wait for Mri

## 2019-07-12 NOTE — Telephone Encounter (Signed)
[facility]/patient   Requesting orders for MRI to be moved to [facility]. PRIOR AUTH # BRAIN    [id number] PRIOR AUTH # [id number]  [id number]  [phone] Fax #[phone]

## 2019-07-12 NOTE — Assessment & Plan Note (Signed)
Check labs  Doxy for 10 days  

## 2019-07-14 NOTE — Telephone Encounter (Signed)
Appts cancelled with [facility] - new referrals should not be needed but please update the location for imaging in both to [facility] [address] [place].  I left msg for the pt to call [facility] to schedule.

## 2019-07-15 ENCOUNTER — Other Ambulatory Visit: Payer: Self-pay

## 2019-07-15 DIAGNOSIS — M5030 Other cervical disc degeneration, unspecified cervical region: Secondary | ICD-10-CM

## 2019-07-15 DIAGNOSIS — G35 Multiple sclerosis: Secondary | ICD-10-CM

## 2019-07-21 ENCOUNTER — Other Ambulatory Visit: Payer: Self-pay

## 2019-07-21 DIAGNOSIS — G35 Multiple sclerosis: Secondary | ICD-10-CM

## 2019-07-21 NOTE — Telephone Encounter (Signed)
Imaging orders changed

## 2019-07-22 ENCOUNTER — Other Ambulatory Visit (HOSPITAL_COMMUNITY): Payer: BC Managed Care – PPO

## 2019-07-22 ENCOUNTER — Ambulatory Visit (HOSPITAL_COMMUNITY): Payer: BC Managed Care – PPO

## 2019-08-01 DIAGNOSIS — Z20822 Contact with and (suspected) exposure to covid-19: Secondary | ICD-10-CM | POA: Diagnosis not present

## 2019-08-31 ENCOUNTER — Ambulatory Visit
Admission: RE | Admit: 2019-08-31 | Discharge: 2019-08-31 | Disposition: A | Discharge status: Home / Self Care | Payer: BC Managed Care – PPO | Source: Ambulatory Visit | Attending: Family Medicine | Admitting: Family Medicine

## 2019-08-31 DIAGNOSIS — G35 Multiple sclerosis: Secondary | ICD-10-CM | POA: Diagnosis not present

## 2019-08-31 DIAGNOSIS — M4803 Spinal stenosis, cervicothoracic region: Secondary | ICD-10-CM | POA: Diagnosis not present

## 2019-08-31 MED ORDER — GADOBENATE DIMEGLUMINE 529 MG/ML IV SOLN
17.0000 mL | Freq: Once | INTRAVENOUS | Status: AC | PRN
  Administered 2019-08-31: 17 mL via INTRAVENOUS

## 2019-09-01 ENCOUNTER — Other Ambulatory Visit: Payer: Self-pay | Admitting: Family Medicine

## 2019-09-01 DIAGNOSIS — R200 Anesthesia of skin: Secondary | ICD-10-CM

## 2019-09-01 DIAGNOSIS — G35 Multiple sclerosis: Secondary | ICD-10-CM

## 2019-09-14 ENCOUNTER — Other Ambulatory Visit: Payer: BC Managed Care – PPO

## 2019-09-28 ENCOUNTER — Ambulatory Visit: Payer: BC Managed Care – PPO | Admitting: Neurology

## 2019-09-28 ENCOUNTER — Other Ambulatory Visit: Payer: Self-pay

## 2019-09-28 ENCOUNTER — Encounter: Payer: Self-pay | Admitting: Neurology

## 2019-09-28 VITALS — BP 138/91 | HR 64 | Ht 69.0 in | Wt 194.0 lb

## 2019-09-28 DIAGNOSIS — G35 Multiple sclerosis: Secondary | ICD-10-CM

## 2019-09-28 DIAGNOSIS — R200 Anesthesia of skin: Secondary | ICD-10-CM | POA: Diagnosis not present

## 2019-09-28 DIAGNOSIS — E559 Vitamin D deficiency, unspecified: Secondary | ICD-10-CM

## 2019-09-28 DIAGNOSIS — Z79899 Other long term (current) drug therapy: Secondary | ICD-10-CM | POA: Diagnosis not present

## 2019-09-28 MED ORDER — ALPRAZOLAM 0.5 MG PO TABS
0.5000 mg | ORAL_TABLET | Freq: Every day | ORAL | 2 refills | Status: DC | PRN
Start: 2019-09-28 — End: 2020-03-30

## 2019-09-28 NOTE — Progress Notes (Signed)
[facility]  PATIENT: Christopher Santana DOB: June 30, 1975  REFERRING DOCTOR OR PCP:   Seabron Spates DO SOURCE: Patient, notes from primary care, imaging and lab reports, MRI images personally reviewed.  _________________________________   HISTORICAL  CHIEF COMPLAINT:  Chief Complaint  Patient presents with  . New Patient (Initial Visit)    RM 12, alone. Internal referral for MS/numbness in feet. Sx started around March 2021. Denies any injury. No other sx.     HISTORY OF PRESENT ILLNESS:  I had the pleasure seeing your patient, Christopher Santana, at the MS center at [facility] for neurologic consultation regarding his numbness and hisotory of MS.  He is a 44 year old man who has numbness and tingling in his legs and left foot clumsiness started a few months ago.  Symptoms improved but are still present and more noticeable when more stressed.     He was diagnosed with MS by Christopher Santana around 2008 when he presented with numbness in his entire body.    MRI showed a focus at C2 and some hemispheric foci consistent with MS.    He had a second opinion at [facility] Christopher Santana) who concurred with the diagnosis.   He was placed on Avonex for just a year.  He then switched to Rebif and stopped around 2014.   He stopped by personal choice.   He has had a few symptoms fluctuating over the past 13 years, especially when stressed at work or home.    The current symptoms are the first lasting over a day since 2008.    In the past, when he had intermittent symptoms, he felt better with Xanax.  In 2008, he had a high dose of a steroid.     Currently, he is experiencing some tingling in both legs, right > left.   In the past tingling was worse on his left.   He had mild left leg weakness that lasted just a few days a couple months ago.   He denies spasticity.    He denies any bladder issues.   Vision is fine.     He denies much fatigue and sleeps well most nights.   He denies  depression.   He has more anxiety when experiencing neurologic symptoms but does not note any most of the time.  He denies issues with cognition.      I personally reviewed the MRI of the brain from 08/31/2019 and compared it with the MRI from 08/18/2012.  The MRI shows a large arachnoid cyst in the left middle fossa.  There are more than a dozen T2/FLAIR hyperintense foci.  Some are in the periventricular white matter and others are in the juxtacortical white matter.  Between 2014 and 2021, about 6 new lesions have developed.  The MRI of the cervical spine also performed 08/31/2019 was compared to an MRI of the cervical spine from 2008.  Both show a focus adjacent to T2.  It is less pronounced on the current MRI than the previous MRI.  Additionally there are multilevel degenerative changes.   REVIEW OF SYSTEMS: Constitutional: No fevers, chills, sweats, or change in appetite Eyes: No visual changes, double vision, eye pain Ear, nose and throat: No hearing loss, ear pain, nasal congestion, sore throat Cardiovascular: No chest pain, palpitations Respiratory: No shortness of breath at rest or with exertion.   No wheezes GastrointestinaI: No nausea, vomiting, diarrhea, abdominal pain, fecal incontinence Genitourinary: No dysuria, urinary retention or frequency.  No  nocturia. Musculoskeletal: No neck pain, back pain Integumentary: No rash, pruritus, skin lesions Neurological: as above Psychiatric: No depression at this time.  some anxiety Endocrine: No palpitations, diaphoresis, change in appetite, change in weigh or increased thirst Hematologic/Lymphatic: No anemia, purpura, petechiae. Allergic/Immunologic: No itchy/runny eyes, nasal congestion, recent allergic reactions, rashes  ALLERGIES: No Known Allergies  HOME MEDICATIONS:  Current Outpatient Medications:  .  ALPRAZolam (XANAX) 0.5 MG tablet, Take 1 tablet (0.5 mg total) by mouth daily as needed for anxiety., Disp: 15 tablet, Rfl:  2  PAST MEDICAL HISTORY: History reviewed. No pertinent past medical history.  PAST SURGICAL HISTORY: Past Surgical History:  Procedure Laterality Date  . CHOLECYSTECTOMY    . KNEE SURGERY Left    around 2011    FAMILY HISTORY: History reviewed. No pertinent family history.  SOCIAL HISTORY:  Social History   Socioeconomic History  . Marital status: Married    Spouse name: Christopher Santana  . Number of children: 0  . Years of education: Not on file  . Highest education level: Not on file  Occupational History  . Not on file  Tobacco Use  . Smoking status: Never Smoker  . Smokeless tobacco: Never Used  Substance and Sexual Activity  . Alcohol use: Yes  . Drug use: No  . Sexual activity: Yes    Partners: Female  Other Topics Concern  . Not on file  Social History Narrative   Right handed    Caffeine use: 3 cups per day   Social Determinants of Health   Financial Resource Strain:   . Difficulty of Paying Living Expenses:   Food Insecurity:   . Worried About Charity fundraiser in the Last Year:   . Arboriculturist in the Last Year:   Transportation Needs:   . Film/video editor (Medical):   Christopher Santana Kitchen Lack of Transportation (Non-Medical):   Physical Activity:   . Days of Exercise per Week:   . Minutes of Exercise per Session:   Stress:   . Feeling of Stress :   Social Connections:   . Frequency of Communication with Friends and Family:   . Frequency of Social Gatherings with Friends and Family:   . Attends Religious Services:   . Active Member of Clubs or Organizations:   . Attends Archivist Meetings:   Christopher Santana Kitchen Marital Status:   Intimate Partner Violence:   . Fear of Current or Ex-Partner:   . Emotionally Abused:   Christopher Santana Kitchen Physically Abused:   . Sexually Abused:      PHYSICAL EXAM  Vitals:   09/28/19 1309  BP: (!) 138/91  Pulse: 64  Weight: 194 lb (88 kg)  Height: 5\' 9"  (1.753 m)    Body mass index is 28.65 kg/m.   Hearing Screening   125Hz  250Hz   500Hz  1000Hz  2000Hz  3000Hz  4000Hz  6000Hz  8000Hz   Right ear:           Left ear:             Visual Acuity Screening   Right eye Left eye Both eyes  Without correction:     With correction: 20/50 20/40 20/20      General: The patient is well-developed and well-nourished and in no acute distress  HEENT:  Head is Kell/AT.  Sclera are anicteric.  Funduscopic exam shows normal optic discs and retinal vessels.  Neck: No carotid bruits are noted.  The neck is nontender.  Cardiovascular: The heart has a regular rate and rhythm with a  normal S1 and S2. There were no murmurs, gallops or rubs.    Skin: Extremities are without rash or  edema.  Musculoskeletal:  Back is nontender  Neurologic Exam  Mental status: The patient is alert and oriented x 3 at the time of the examination. The patient has apparent normal recent and remote memory, with an apparently normal attention span and concentration ability.   Speech is normal.  Cranial nerves: Extraocular movements are full. Pupils are equal, round, and reactive to light and accomodation.  Visual fields are full.  Facial symmetry is present. There is good facial sensation to soft touch bilaterally.Facial strength is normal.  Trapezius and sternocleidomastoid strength is normal. No dysarthria is noted.  The tongue is midline, and the patient has symmetric elevation of the soft palate. No obvious hearing deficits are noted.  Motor:  Muscle bulk is normal.   Tone is normal. Strength is  5 / 5 in all 4 extremities.   Sensory: Sensory testing is intact to pinprick, soft touch and vibration sensation in all 4 extremities.  Coordination: Cerebellar testing reveals good finger-nose-finger and heel-to-shin bilaterally.  Gait and station: Station is normal.   Gait is normal. Tandem gait is normal. Romberg is negative.   Reflexes: Deep tendon reflexes are symmetric and normal bilaterally.   Plantar responses are flexor.    DIAGNOSTIC DATA (LABS, IMAGING,  TESTING) - I reviewed patient records, labs, notes, testing and imaging myself where available.  Lab Results  Component Value Date   WBC 4.2 07/08/2019   HGB 13.2 07/08/2019   HCT 40.5 07/08/2019   MCV 79.0 07/08/2019   PLT 223.0 07/08/2019      Component Value Date/Time   NA 138 07/08/2019 1343   K 4.5 07/08/2019 1343   CL 103 07/08/2019 1343   CO2 28 07/08/2019 1343   GLUCOSE 82 07/08/2019 1343   BUN 16 07/08/2019 1343   CREATININE 1.01 07/08/2019 1343   CALCIUM 9.5 07/08/2019 1343   PROT 7.2 07/08/2019 1343   ALBUMIN 4.7 07/08/2019 1343   AST 31 07/08/2019 1343   ALT 39 07/08/2019 1343   ALKPHOS 41 07/08/2019 1343   BILITOT 0.5 07/08/2019 1343   No results found for: CHOL, HDL, LDLCALC, LDLDIRECT, TRIG, CHOLHDL No results found for: HWEX9B Lab Results  Component Value Date   VITAMINB12 330 07/08/2019   Lab Results  Component Value Date   TSH 1.02 07/08/2019       ASSESSMENT AND PLAN  Multiple sclerosis (HCC) - Plan: CBC with Differential/Platelet, Comprehensive metabolic panel, QuantiFERON-TB Gold Plus, Varicella zoster antibody, IgG, Hepatitis B core antibody, total, Hepatitis B surface antigen, Hepatitis B surface antibody,qualitative, HIV Antibody (routine testing w rflx)  High risk medication use - Plan: CBC with Differential/Platelet, Comprehensive metabolic panel, QuantiFERON-TB Gold Plus, Varicella zoster antibody, IgG, Hepatitis B core antibody, total, Hepatitis B surface antigen, Hepatitis B surface antibody,qualitative, HIV Antibody (routine testing w rflx)  Vitamin D deficiency - Plan: VITAMIN D 25 Hydroxy (Vit-D Deficiency, Fractures)  Numbness   In summary, 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  He was on medications between 2008 and 2014 but off medication for the last 7 years.  Comparing MRIs from 2014 and 2021, he has had about 6-7 new lesions during those 7 years.  Most of them would be asymptomatic.  He has  no new cervical spine lesion (has one at C2) and it is possible his new symptoms were due to a thoracic spine  focus that was not imaged.  Regardless, he is improving and closer to baseline from his recent exacerbation.  Due to the recent exacerbation as well as to evidence of activity on the MRI, I recommend that he begin disease modifying therapies.  We spent some time discussing options.  The level of aggressiveness of his MS is mild.  We spent most of the time discussing Aubagio, Vumerity and Mavenclad.  We went over the risks and benefits of all 3 of these medications.  We will check some blood work and he will give this more thought over the next few days.  I will also check for vitamin D deficiency as low vitamin D has been associated with a higher risk of relapsing MS.  He will return to see me in 3 months or sooner if there are new or worsening neurologic symptoms.  Thank you for asking me to see Dr. Christell Faith for his MS.  Please let me know if I can be of further assistance with him or other patients in the future.   Kadarius Cuffe A. Epimenio Foot, MD, [facility] 09/28/2019, 3:37 PM Certified in Neurology, Clinical Neurophysiology, Sleep Medicine and Neuroimaging  [facility] [address] [place], [place] [address] [phone]

## 2019-09-30 LAB — QUANTIFERON-TB GOLD PLUS
QuantiFERON Mitogen Value: >10 IU/mL
QuantiFERON Nil Value: 0 IU/mL
QuantiFERON TB1 Ag Value: 0 IU/mL
QuantiFERON TB2 Ag Value: 0.02 IU/mL
QuantiFERON-TB Gold Plus: NEGATIVE

## 2019-09-30 LAB — COMPREHENSIVE METABOLIC PANEL
ALT: 32 IU/L (ref 0–44)
AST: 38 IU/L (ref 0–40)
Albumin/Globulin Ratio: 2 (ref 1.2–2.2)
Albumin: 4.8 g/dL (ref 4.0–5.0)
Alkaline Phosphatase: 48 IU/L (ref 48–121)
BUN/Creatinine Ratio: 14 (ref 9–20)
BUN: 14 mg/dL (ref 6–24)
Bilirubin Total: 0.4 mg/dL (ref 0.0–1.2)
CO2: 26 mmol/L (ref 20–29)
Calcium: 9.9 mg/dL (ref 8.7–10.2)
Chloride: 100 mmol/L (ref 96–106)
Creatinine, Ser: 0.97 mg/dL (ref 0.76–1.27)
GFR calc Af Amer: 109 mL/min/{1.73_m2} (ref >59)
GFR calc non Af Amer: 94 mL/min/{1.73_m2} (ref >59)
Globulin, Total: 2.4 g/dL (ref 1.5–4.5)
Glucose: 87 mg/dL (ref 65–99)
Potassium: 4.1 mmol/L (ref 3.5–5.2)
Sodium: 139 mmol/L (ref 134–144)
Total Protein: 7.2 g/dL (ref 6.0–8.5)

## 2019-09-30 LAB — CBC WITH DIFFERENTIAL/PLATELET
Basophils Absolute: 0.1 10*3/uL (ref 0.0–0.2)
Basos: 1 %
EOS (ABSOLUTE): 0.2 10*3/uL (ref 0.0–0.4)
Eos: 4 %
Hematocrit: 42.9 % (ref 37.5–51.0)
Hemoglobin: 13.6 g/dL (ref 13.0–17.7)
Immature Grans (Abs): 0 10*3/uL (ref 0.0–0.1)
Immature Granulocytes: 0 %
Lymphocytes Absolute: 1.9 10*3/uL (ref 0.7–3.1)
Lymphs: 36 %
MCH: 25.2 pg — ABNORMAL LOW (ref 26.6–33.0)
MCHC: 31.7 g/dL (ref 31.5–35.7)
MCV: 80 fL (ref 79–97)
Monocytes Absolute: 0.3 10*3/uL (ref 0.1–0.9)
Monocytes: 6 %
Neutrophils Absolute: 2.7 10*3/uL (ref 1.4–7.0)
Neutrophils: 53 %
Platelets: 288 10*3/uL (ref 150–450)
RBC: 5.39 x10E6/uL (ref 4.14–5.80)
RDW: 14.7 % (ref 11.6–15.4)
WBC: 5.3 10*3/uL (ref 3.4–10.8)

## 2019-09-30 LAB — VITAMIN D 25 HYDROXY (VIT D DEFICIENCY, FRACTURES): Vit D, 25-Hydroxy: 36.3 ng/mL (ref 30.0–100.0)

## 2019-09-30 LAB — HEPATITIS B SURFACE ANTIGEN: Hepatitis B Surface Ag: NEGATIVE

## 2019-09-30 LAB — VARICELLA ZOSTER ANTIBODY, IGG: Varicella zoster IgG: 2704 {index}

## 2019-09-30 LAB — HEPATITIS B SURFACE ANTIBODY,QUALITATIVE: Hep B Surface Ab, Qual: NONREACTIVE

## 2019-09-30 LAB — HEPATITIS B CORE ANTIBODY, TOTAL: Hep B Core Total Ab: NEGATIVE

## 2019-09-30 LAB — HIV ANTIBODY (ROUTINE TESTING W REFLEX): HIV Screen 4th Generation wRfx: NONREACTIVE

## 2019-10-04 ENCOUNTER — Telehealth: Payer: Self-pay | Admitting: Neurology

## 2019-10-04 NOTE — Telephone Encounter (Signed)
I called and left a voicemail message.  The lab work looks good.  Therefore any of the medications that we discussed at the visit last week could be considered for his MS.  I will be out the next few days and asked him to let me know if you want me to call him on Monday

## 2019-10-20 ENCOUNTER — Telehealth: Payer: Self-pay | Admitting: Neurology

## 2019-10-20 NOTE — Telephone Encounter (Signed)
I called and left a message.  Lab work all came back normal.  Therefore, he could go on any of the 3 medications that we discussed, Mavenclad, Vumerity or Aubagio.  I will try to reach him later this week.

## 2019-10-22 NOTE — Telephone Encounter (Signed)
I spoke with Dr. Christell Faith about the lab work and his options for disease modifying therapies.    He is most interested in [place].  We will send in the service request form on Monday (he signed the form and it is in the top slot of the black file).

## 2019-10-25 NOTE — Telephone Encounter (Signed)
Mavenclad start form faxed to [employer]. Received a receipt of confirmation.  PA for [facility] initiated on covermymeds. FIE:[id number]  "Your information has been submitted to [facility] [place]. [employer] [place] will review the request and notify you of the determination decision directly, typically within 72 hours of receiving all information.  You will also receive your request decision electronically. To check for an update later, open this request again from your dashboard.  If [employer] has not responded within the specified timeframe or if you have any questions about your PA submission, contact [employer] [place] directly at [phone]."

## 2019-10-25 NOTE — Telephone Encounter (Signed)
Gave signed form to KD,RN to complete/send in.

## 2019-11-01 NOTE — Telephone Encounter (Signed)
Completed appeal letter with Dr. Epimenio Foot. Faxed to [employer]. Received a receipt of confirmation.

## 2019-11-01 NOTE — Telephone Encounter (Signed)
I called pt. No answer, left a message asking him to call me back.  Since mavenclad was denied by [facility], he will need to sign the consent for [place] to appeal on his behalf.

## 2019-11-01 NOTE — Telephone Encounter (Signed)
Per covermymeds the PA for [facility] was denied. I have not received a denial letter. I called [employer]. Pt will need to sign a consent for [place] to appeal this decision on his behalf. They will fax me this consent and the denial letter. I will call pt when this is received.

## 2019-11-01 NOTE — Telephone Encounter (Signed)
I called pt. His email address is [email or web address]. I will email him the form and he will fax it back to me.

## 2019-11-01 NOTE — Telephone Encounter (Signed)
Patient returned your call. I gave him your message. He says that if possible you can e-mail this to him.

## 2019-11-01 NOTE — Telephone Encounter (Signed)
Received denial letter from [facility]. Mavenclad has been denied because pt has not tried and failed generic dimethyl furamate or glatiramer acetate, has highly active MS with at least 2 relapses in the last year and have certain lesions on an MRI, and has been treated with three different classes of MS medications.  Received consent for appeal from pt.

## 2019-11-17 NOTE — Telephone Encounter (Signed)
Called [employer] appeals department at [phone] to check on status of appeal for [facility]. Spoke with Bonita Quin. She states appeal denied, deemed not medically necessary. She will fax denial letter to [place] at [phone].

## 2019-11-17 NOTE — Telephone Encounter (Addendum)
Spoke with Dr. Epimenio Foot. He would like me to contact [employer] to see if we can get pt on free drug. I called [employer] at [phone]. Spoke with [place]. They have PA denial but requesting we fax appeal denial. I faxed this to them at [phone]. Received fax confirmation. They will try and contact pt to screen for their free drug program. Advised per Dr. Epimenio Foot that pt is medically cleared to start [facility] if they are able to get him on free drug. Confirmed his phone#[phone]. They will try to call and text him. They have been unable to reach him thus far. Advised I will also call to give him a heads up that they will be reaching out to him. I called pt and left detailed message providing him an update. Advised him to be on look out from phone call from [employer] (it will be an [phone] number). Advised him to call us back if he has any further questions/concerns.

## 2019-11-22 NOTE — Telephone Encounter (Addendum)
I called [employer]. They received the appeal denial. They will reach out to the pt for the next steps.

## 2019-11-30 NOTE — Telephone Encounter (Signed)
I called [employer], spoke with Melody. The pt's application for free drug is in process. They may need [place] to complete a second level appeal but they will let us know.

## 2019-12-05 DIAGNOSIS — Z20822 Contact with and (suspected) exposure to covid-19: Secondary | ICD-10-CM | POA: Diagnosis not present

## 2019-12-07 NOTE — Telephone Encounter (Signed)
I called [employer]. They called pt today to schedule shipment of mavenclad but had to leave pt a message.

## 2019-12-14 NOTE — Telephone Encounter (Signed)
I called [employer], spoke to Tammy. Pt has been approved for Free Drug Program but has not returned their calls to set up delivery.  I called pt. No answer, left a message asking him to call me back. I will also send pt a mychart message.

## 2019-12-15 NOTE — Telephone Encounter (Signed)
Received notification from [employer] that pt scheduled delivery of the [facility] and should receive in on 12/16/2019.

## 2020-01-24 DIAGNOSIS — J0140 Acute pansinusitis, unspecified: Secondary | ICD-10-CM | POA: Diagnosis not present

## 2020-01-24 DIAGNOSIS — R030 Elevated blood-pressure reading, without diagnosis of hypertension: Secondary | ICD-10-CM | POA: Diagnosis not present

## 2020-02-01 ENCOUNTER — Other Ambulatory Visit: Payer: Self-pay | Admitting: Family Medicine

## 2020-02-01 MED ORDER — AMOXICILLIN-POT CLAVULANATE 875-125 MG PO TABS: 1.0000 | ORAL_TABLET | Freq: Two times a day (BID) | ORAL | 0 refills | Status: DC

## 2020-02-08 ENCOUNTER — Ambulatory Visit: Payer: BC Managed Care – PPO | Admitting: Family Medicine

## 2020-02-08 ENCOUNTER — Other Ambulatory Visit: Payer: Self-pay

## 2020-02-08 ENCOUNTER — Encounter: Payer: Self-pay | Admitting: Family Medicine

## 2020-02-08 VITALS — BP 140/90 | HR 67 | Temp 98.1°F | Resp 18 | Ht 69.0 in | Wt 198.2 lb

## 2020-02-08 DIAGNOSIS — J0140 Acute pansinusitis, unspecified: Secondary | ICD-10-CM | POA: Diagnosis not present

## 2020-02-08 DIAGNOSIS — G8929 Other chronic pain: Secondary | ICD-10-CM

## 2020-02-08 DIAGNOSIS — B360 Pityriasis versicolor: Secondary | ICD-10-CM

## 2020-02-08 DIAGNOSIS — J069 Acute upper respiratory infection, unspecified: Secondary | ICD-10-CM

## 2020-02-08 DIAGNOSIS — S80861A Insect bite (nonvenomous), right lower leg, initial encounter: Secondary | ICD-10-CM

## 2020-02-08 DIAGNOSIS — M25511 Pain in right shoulder: Secondary | ICD-10-CM

## 2020-02-08 MED ORDER — KETOCONAZOLE 200 MG PO TABS: ORAL_TABLET | ORAL | 2 refills | Status: AC

## 2020-02-08 MED ORDER — CETIRIZINE HCL 10 MG PO TABS: 10.0000 mg | ORAL_TABLET | Freq: Every day | ORAL | 11 refills | Status: AC

## 2020-02-08 MED ORDER — PREDNISONE 10 MG PO TABS: ORAL_TABLET | ORAL | 0 refills | Status: DC

## 2020-02-08 MED ORDER — FLUTICASONE PROPIONATE 50 MCG/ACT NA SUSP: 2.0000 | Freq: Every day | NASAL | 6 refills | Status: AC

## 2020-02-08 MED ORDER — DOXYCYCLINE HYCLATE 100 MG PO TABS: 100.0000 mg | ORAL_TABLET | Freq: Two times a day (BID) | ORAL | 0 refills | Status: DC

## 2020-02-08 NOTE — Assessment & Plan Note (Signed)
Doxycycline  pred taper  con't flonase and antihistamine

## 2020-02-08 NOTE — Patient Instructions (Signed)
Sinusitis, Adult Sinusitis is inflammation of your sinuses. Sinuses are hollow spaces in the bones around your face. Your sinuses are located:  Around your eyes.  In the middle of your forehead.  Behind your nose.  In your cheekbones. Mucus normally drains out of your sinuses. When your nasal tissues become inflamed or swollen, mucus can become trapped or blocked. This allows bacteria, viruses, and fungi to grow, which leads to infection. Most infections of the sinuses are caused by a virus. Sinusitis can develop quickly. It can last for up to 4 weeks (acute) or for more than 12 weeks (chronic). Sinusitis often develops after a cold. What are the causes? This condition is caused by anything that creates swelling in the sinuses or stops mucus from draining. This includes:  Allergies.  Asthma.  Infection from bacteria or viruses.  Deformities or blockages in your nose or sinuses.  Abnormal growths in the nose (nasal polyps).  Pollutants, such as chemicals or irritants in the air.  Infection from fungi (rare). What increases the risk? You are more likely to develop this condition if you:  Have a weak body defense system (immune system).  Do a lot of swimming or diving.  Overuse nasal sprays.  Smoke. What are the signs or symptoms? The main symptoms of this condition are pain and a feeling of pressure around the affected sinuses. Other symptoms include:  Stuffy nose or congestion.  Thick drainage from your nose.  Swelling and warmth over the affected sinuses.  Headache.  Upper toothache.  A cough that may get worse at night.  Extra mucus that collects in the throat or the back of the nose (postnasal drip).  Decreased sense of smell and taste.  Fatigue.  A fever.  Sore throat.  Bad breath. How is this diagnosed? This condition is diagnosed based on:  Your symptoms.  Your medical history.  A physical exam.  Tests to find out if your condition is  acute or chronic. This may include: ? Checking your nose for nasal polyps. ? Viewing your sinuses using a device that has a light (endoscope). ? Testing for allergies or bacteria. ? Imaging tests, such as an MRI or CT scan. In rare cases, a bone biopsy may be done to rule out more serious types of fungal sinus disease. How is this treated? Treatment for sinusitis depends on the cause and whether your condition is chronic or acute.  If caused by a virus, your symptoms should go away on their own within 10 days. You may be given medicines to relieve symptoms. They include: ? Medicines that shrink swollen nasal passages (topical intranasal decongestants). ? Medicines that treat allergies (antihistamines). ? A spray that eases inflammation of the nostrils (topical intranasal corticosteroids). ? Rinses that help get rid of thick mucus in your nose (nasal saline washes).  If caused by bacteria, your health care provider may recommend waiting to see if your symptoms improve. Most bacterial infections will get better without antibiotic medicine. You may be given antibiotics if you have: ? A severe infection. ? A weak immune system.  If caused by narrow nasal passages or nasal polyps, you may need to have surgery. Follow these instructions at home: Medicines  Take, use, or apply over-the-counter and prescription medicines only as told by your health care provider. These may include nasal sprays.  If you were prescribed an antibiotic medicine, take it as told by your health care provider. Do not stop taking the antibiotic even if you start   to feel better. Hydrate and humidify   Drink enough fluid to keep your urine pale yellow. Staying hydrated will help to thin your mucus.  Use a cool mist humidifier to keep the humidity level in your home above 50%.  Inhale steam for 10-15 minutes, 3-4 times a day, or as told by your health care provider. You can do this in the bathroom while a hot shower is  running.  Limit your exposure to cool or dry air. Rest  Rest as much as possible.  Sleep with your head raised (elevated).  Make sure you get enough sleep each night. General instructions   Apply a warm, moist washcloth to your face 3-4 times a day or as told by your health care provider. This will help with discomfort.  Wash your hands often with soap and water to reduce your exposure to germs. If soap and water are not available, use hand sanitizer.  Do not smoke. Avoid being around people who are smoking (secondhand smoke).  Keep all follow-up visits as told by your health care provider. This is important. Contact a health care provider if:  You have a fever.  Your symptoms get worse.  Your symptoms do not improve within 10 days. Get help right away if:  You have a severe headache.  You have persistent vomiting.  You have severe pain or swelling around your face or eyes.  You have vision problems.  You develop confusion.  Your neck is stiff.  You have trouble breathing. Summary  Sinusitis is soreness and inflammation of your sinuses. Sinuses are hollow spaces in the bones around your face.  This condition is caused by nasal tissues that become inflamed or swollen. The swelling traps or blocks the flow of mucus. This allows bacteria, viruses, and fungi to grow, which leads to infection.  If you were prescribed an antibiotic medicine, take it as told by your health care provider. Do not stop taking the antibiotic even if you start to feel better.  Keep all follow-up visits as told by your health care provider. This is important. This information is not intended to replace advice given to you by your health care provider. Make sure you discuss any questions you have with your health care provider. Document Revised: 08/25/2017 Document Reviewed: 08/25/2017 Elsevier Patient Education  2020 [employer]  

## 2020-02-08 NOTE — Progress Notes (Signed)
Patient ID: Christopher Santana, male    DOB: [birth year]  Age: 44 y.o. MRN: [id number]    Subjective:  Subjective  HPI Christopher Santana presents for 3weeks of r ear pain, throat pain and facial pain---- no temp -- taking augmentin with no relief.  He is also taking flonase and an antihistamine   Review of Systems  Constitutional: Negative for appetite change, diaphoresis, fatigue and unexpected weight change.  Eyes: Negative for pain, redness and visual disturbance.  Respiratory: Negative for cough, chest tightness, shortness of breath and wheezing.   Cardiovascular: Negative for chest pain, palpitations and leg swelling.  Endocrine: Negative for cold intolerance, heat intolerance, polydipsia, polyphagia and polyuria.  Genitourinary: Negative for difficulty urinating, dysuria and frequency.  Neurological: Negative for dizziness, light-headedness, numbness and headaches.    History No past medical history on file.  He has a past surgical history that includes Cholecystectomy and Knee surgery (Left).   His family history is not on file.He reports that he has never smoked. He has never used smokeless tobacco. He reports current alcohol use. He reports that he does not use drugs.  Current Outpatient Medications on File Prior to Visit  Medication Sig Dispense Refill  . ALPRAZolam (XANAX) 0.5 MG tablet Take 1 tablet (0.5 mg total) by mouth daily as needed for anxiety. 15 tablet 2   No current facility-administered medications on file prior to visit.     Objective:  Objective  Physical Exam Vitals and nursing note reviewed.  Constitutional:      Appearance: He is well-developed.  HENT:     Right Ear: External ear normal. There is no impacted cerumen. Tympanic membrane is injected. Tympanic membrane is not perforated or retracted.     Left Ear: Tympanic membrane, ear canal and external ear normal.     Nose:     Right Sinus: Maxillary sinus tenderness and frontal sinus tenderness present.      Left Sinus: Maxillary sinus tenderness and frontal sinus tenderness present.  Eyes:     General:        Right eye: No discharge.        Left eye: No discharge.     Conjunctiva/sclera: Conjunctivae normal.  Cardiovascular:     Rate and Rhythm: Normal rate and regular rhythm.     Heart sounds: Normal heart sounds. No murmur heard.   Pulmonary:     Effort: Pulmonary effort is normal. No respiratory distress.     Breath sounds: Normal breath sounds. No wheezing or rales.  Chest:     Chest wall: No tenderness.  Lymphadenopathy:     Cervical: Cervical adenopathy present.  Neurological:     Mental Status: He is alert and oriented to person, place, and time.    BP 140/90 (BP Location: Right Arm, Patient Position: Sitting, Cuff Size: Large)   Pulse 67   Temp 98.1 F (36.7 C) (Oral)   Resp 18   Ht 5\' 9"  (1.753 m)   Wt 198 lb 3.2 oz (89.9 kg)   SpO2 97%   BMI 29.27 kg/m  Wt Readings from Last 3 Encounters:  02/08/20 198 lb 3.2 oz (89.9 kg)  09/28/19 194 lb (88 kg)  07/08/19 193 lb (87.5 kg)     Lab Results  Component Value Date   WBC 5.3 09/28/2019   HGB 13.6 09/28/2019   HCT 42.9 09/28/2019   PLT 288 09/28/2019   GLUCOSE 87 09/28/2019   ALT 32 09/28/2019   AST 38 09/28/2019   NA  139 09/28/2019   K 4.1 09/28/2019   CL 100 09/28/2019   CREATININE 0.97 09/28/2019   BUN 14 09/28/2019   CO2 26 09/28/2019   TSH 1.02 07/08/2019    MR Brain W Wo Contrast  Result Date: 09/01/2019 CLINICAL DATA:  Multiple sclerosis. New numbness/tingling in the right foot. EXAM: MRI HEAD WITHOUT AND WITH CONTRAST MRI CERVICAL SPINE WITHOUT AND WITH CONTRAST TECHNIQUE: Multiplanar, multiecho pulse sequences of the brain and surrounding structures, and cervical spine, to include the craniocervical junction and cervicothoracic junction, were obtained without and with intravenous contrast. CONTRAST:  39mL MULTIHANCE GADOBENATE DIMEGLUMINE 529 MG/ML IV SOLN COMPARISON:  Head MRI 08/17/2012 and  cervical spine MRI 06/05/2006 FINDINGS: MRI HEAD FINDINGS Brain: No acute infarct, intracranial hemorrhage, intra-axial mass, or midline shift is identified. The ventricles are normal in size. An 8.5 cm arachnoid cyst in the left middle cranial fossa is unchanged with extension superiorly over the left lateral cerebral convexity to the frontal operculum level. An enlarged CSF space in the posterior midline of the posterior fossa is unchanged and may represent a mega cisterna magna or arachnoid cyst. Small foci of T2 hyperintensity are again seen in the juxtacortical, deep, and periventricular white matter bilaterally and have mildly progressed from the prior study. New lesions are annotated on series 10 and are most notable in the right lateral periventricular white matter. No lesions demonstrate restricted diffusion or enhancement, and no lesions are identified in the posterior fossa. Vascular: Major intracranial vascular flow voids are preserved. Skull and upper cervical spine: No suspicious marrow lesion. Thinning of the calvarium overlying the left middle cranial fossa arachnoid cyst. Sinuses/Orbits: Unremarkable orbits. Mild mucosal thickening in the ethmoid and maxillary sinuses. At most trace bilateral mastoid fluid. Other: None. MRI CERVICAL SPINE FINDINGS Alignment: Cervical spine straightening. New trace anterolisthesis of C7 on T1. Vertebrae: No fracture or suspicious marrow lesion. New moderate degenerative endplate edema at D2-2 greater than C5-6. Moderate right-sided facet edema at C2-3. Cord: Small focus of faint T2 hyperintensity in the midline of the spinal cord at C2, decreased in size and intensity from the prior study. No new cord lesion or abnormal enhancement. Normal cord volume. Posterior Fossa, vertebral arteries, paraspinal tissues: Unremarkable. Disc levels: C2-3: Disc bulging, uncovertebral spurring, and moderate to severe right facet arthrosis result in new moderate right neural foraminal  stenosis without spinal stenosis. C3-4: Broad-based posterior disc osteophyte complex results in mild spinal stenosis without significant neural foraminal stenosis, unchanged. C4-5: Broad-based posterior disc osteophyte complex results in mild spinal stenosis and moderate to severe right and moderate left neural foraminal stenosis. Right neural foraminal stenosis is new. C5-6: Broad-based posterior disc osteophyte complex results in borderline spinal stenosis and moderate right and moderate to severe left neural foraminal stenosis, mildly progressed. C6-7: Broad-based posterior disc osteophyte complex results in mild spinal stenosis and severe bilateral neural foraminal stenosis, progressed. C7-T1: New anterolisthesis with mild bulging of uncovered disc, uncovertebral spurring, and moderate right and severe left facet arthrosis result in borderline right neural foraminal stenosis without spinal stenosis. T1-2: Only imaged sagittally. A left central to left foraminal disc osteophyte complex and mild left facet arthrosis result in mild spinal stenosis and mild left neural foraminal stenosis, mildly progressed from prior. IMPRESSION: 1. Mild progression of cerebral white matter disease consistent with multiple sclerosis. No evidence of active demyelination. 2. Decreased size and intensity of spinal cord lesion at C2. No evidence of new cervical spinal cord involvement. 3. Progressive cervical disc and facet degeneration  with mild multilevel spinal stenosis and up to severe neural foraminal stenosis as above. Electronically Signed   By: Sebastian Ache M.D.   On: 09/01/2019 11:25   MR CERVICAL SPINE W WO CONTRAST  Result Date: 09/01/2019 CLINICAL DATA:  Multiple sclerosis. New numbness/tingling in the right foot. EXAM: MRI HEAD WITHOUT AND WITH CONTRAST MRI CERVICAL SPINE WITHOUT AND WITH CONTRAST TECHNIQUE: Multiplanar, multiecho pulse sequences of the brain and surrounding structures, and cervical spine, to include  the craniocervical junction and cervicothoracic junction, were obtained without and with intravenous contrast. CONTRAST:  33mL MULTIHANCE GADOBENATE DIMEGLUMINE 529 MG/ML IV SOLN COMPARISON:  Head MRI 08/17/2012 and cervical spine MRI 06/05/2006 FINDINGS: MRI HEAD FINDINGS Brain: No acute infarct, intracranial hemorrhage, intra-axial mass, or midline shift is identified. The ventricles are normal in size. An 8.5 cm arachnoid cyst in the left middle cranial fossa is unchanged with extension superiorly over the left lateral cerebral convexity to the frontal operculum level. An enlarged CSF space in the posterior midline of the posterior fossa is unchanged and may represent a mega cisterna magna or arachnoid cyst. Small foci of T2 hyperintensity are again seen in the juxtacortical, deep, and periventricular white matter bilaterally and have mildly progressed from the prior study. New lesions are annotated on series 10 and are most notable in the right lateral periventricular white matter. No lesions demonstrate restricted diffusion or enhancement, and no lesions are identified in the posterior fossa. Vascular: Major intracranial vascular flow voids are preserved. Skull and upper cervical spine: No suspicious marrow lesion. Thinning of the calvarium overlying the left middle cranial fossa arachnoid cyst. Sinuses/Orbits: Unremarkable orbits. Mild mucosal thickening in the ethmoid and maxillary sinuses. At most trace bilateral mastoid fluid. Other: None. MRI CERVICAL SPINE FINDINGS Alignment: Cervical spine straightening. New trace anterolisthesis of C7 on T1. Vertebrae: No fracture or suspicious marrow lesion. New moderate degenerative endplate edema at Z4-8 greater than C5-6. Moderate right-sided facet edema at C2-3. Cord: Small focus of faint T2 hyperintensity in the midline of the spinal cord at C2, decreased in size and intensity from the prior study. No new cord lesion or abnormal enhancement. Normal cord volume.  Posterior Fossa, vertebral arteries, paraspinal tissues: Unremarkable. Disc levels: C2-3: Disc bulging, uncovertebral spurring, and moderate to severe right facet arthrosis result in new moderate right neural foraminal stenosis without spinal stenosis. C3-4: Broad-based posterior disc osteophyte complex results in mild spinal stenosis without significant neural foraminal stenosis, unchanged. C4-5: Broad-based posterior disc osteophyte complex results in mild spinal stenosis and moderate to severe right and moderate left neural foraminal stenosis. Right neural foraminal stenosis is new. C5-6: Broad-based posterior disc osteophyte complex results in borderline spinal stenosis and moderate right and moderate to severe left neural foraminal stenosis, mildly progressed. C6-7: Broad-based posterior disc osteophyte complex results in mild spinal stenosis and severe bilateral neural foraminal stenosis, progressed. C7-T1: New anterolisthesis with mild bulging of uncovered disc, uncovertebral spurring, and moderate right and severe left facet arthrosis result in borderline right neural foraminal stenosis without spinal stenosis. T1-2: Only imaged sagittally. A left central to left foraminal disc osteophyte complex and mild left facet arthrosis result in mild spinal stenosis and mild left neural foraminal stenosis, mildly progressed from prior. IMPRESSION: 1. Mild progression of cerebral white matter disease consistent with multiple sclerosis. No evidence of active demyelination. 2. Decreased size and intensity of spinal cord lesion at C2. No evidence of new cervical spinal cord involvement. 3. Progressive cervical disc and facet degeneration with mild multilevel spinal  stenosis and up to severe neural foraminal stenosis as above. Electronically Signed   By: Sebastian Ache M.D.   On: 09/01/2019 11:25     Assessment & Plan:  Plan  I have discontinued Greggory Stallion Damore's amoxicillin-clavulanate. I am also having him start on  fluticasone and cetirizine. Additionally, I am having him maintain his ALPRAZolam, ketoconazole, predniSONE, and doxycycline.  Meds ordered this encounter  Medications  . fluticasone (FLONASE) 50 MCG/ACT nasal spray    Sig: Place 2 sprays into both nostrils daily.    Dispense:  16 g    Refill:  6  . cetirizine (ZYRTEC) 10 MG tablet    Sig: Take 1 tablet (10 mg total) by mouth daily.    Dispense:  30 tablet    Refill:  11  . ketoconazole (NIZORAL) 200 MG tablet    Sig: As directed    Dispense:  7 tablet    Refill:  2  . predniSONE (DELTASONE) 10 MG tablet    Sig: TAKE 3 TABLETS PO QD FOR 3 DAYS THEN TAKE 2 TABLETS PO QD FOR 3 DAYS THEN TAKE 1 TABLET PO QD FOR 3 DAYS THEN TAKE 1/2 TAB PO QD FOR 3 DAYS    Dispense:  20 tablet    Refill:  0  . doxycycline (VIBRA-TABS) 100 MG tablet    Sig: Take 1 tablet (100 mg total) by mouth 2 (two) times daily.    Dispense:  20 tablet    Refill:  0    Problem List Items Addressed This Visit      Unprioritized   Acute non-recurrent pansinusitis - Primary    Doxycycline  pred taper  con't flonase and antihistamine       Relevant Medications   fluticasone (FLONASE) 50 MCG/ACT nasal spray   cetirizine (ZYRTEC) 10 MG tablet   ketoconazole (NIZORAL) 200 MG tablet   predniSONE (DELTASONE) 10 MG tablet   doxycycline (VIBRA-TABS) 100 MG tablet   RESOLVED: Tick bite of right lower leg   Relevant Medications   doxycycline (VIBRA-TABS) 100 MG tablet    Other Visit Diagnoses    Viral upper respiratory tract infection       Relevant Medications   fluticasone (FLONASE) 50 MCG/ACT nasal spray   cetirizine (ZYRTEC) 10 MG tablet   ketoconazole (NIZORAL) 200 MG tablet   Tinea versicolor       Relevant Medications   ketoconazole (NIZORAL) 200 MG tablet   Chronic right shoulder pain       Relevant Medications   predniSONE (DELTASONE) 10 MG tablet      Follow-up: Return if symptoms worsen or fail to improve.  Donato Schultz, DO

## 2020-03-19 DIAGNOSIS — Z1152 Encounter for screening for COVID-19: Secondary | ICD-10-CM | POA: Diagnosis not present

## 2020-03-19 DIAGNOSIS — Z03818 Encounter for observation for suspected exposure to other biological agents ruled out: Secondary | ICD-10-CM | POA: Diagnosis not present

## 2020-03-20 ENCOUNTER — Telehealth: Payer: Self-pay | Admitting: Neurology

## 2020-03-20 NOTE — Telephone Encounter (Signed)
[facility]) called, update on starting [employer]. Have attempted to contact patient with no success. Can reach me directly: [phone]

## 2020-03-20 NOTE — Telephone Encounter (Signed)
Pt was approved for free drug previously.

## 2020-03-29 ENCOUNTER — Other Ambulatory Visit: Payer: Self-pay | Admitting: Neurology

## 2020-03-30 NOTE — Telephone Encounter (Signed)
Last filled on 12/17/19 #15. Last seen on 09/28/19. Next visit on 07/04/20.

## 2020-04-03 DIAGNOSIS — U071 COVID-19: Secondary | ICD-10-CM | POA: Diagnosis not present

## 2020-04-03 DIAGNOSIS — Z20822 Contact with and (suspected) exposure to covid-19: Secondary | ICD-10-CM | POA: Diagnosis not present

## 2020-05-13 ENCOUNTER — Telehealth: Payer: Self-pay | Admitting: Neurology

## 2020-05-13 NOTE — Telephone Encounter (Signed)
We received a communication from [facility] [employer] that Christopher Santana took [facility] pill 04/30/2020.  He has an appointment with me 07/04/2020.

## 2020-05-29 ENCOUNTER — Telehealth: Payer: Self-pay | Admitting: *Deleted

## 2020-05-29 NOTE — Telephone Encounter (Signed)
Received fax from [facility] that pt scheduled delivery of [employer] and should have received it on 05/26/20. [facility] ID# [id number].

## 2020-05-30 DIAGNOSIS — Z03818 Encounter for observation for suspected exposure to other biological agents ruled out: Secondary | ICD-10-CM | POA: Diagnosis not present

## 2020-05-30 DIAGNOSIS — Z20822 Contact with and (suspected) exposure to covid-19: Secondary | ICD-10-CM | POA: Diagnosis not present

## 2020-05-31 DIAGNOSIS — Z03818 Encounter for observation for suspected exposure to other biological agents ruled out: Secondary | ICD-10-CM | POA: Diagnosis not present

## 2020-05-31 DIAGNOSIS — Z20822 Contact with and (suspected) exposure to covid-19: Secondary | ICD-10-CM | POA: Diagnosis not present

## 2020-07-04 ENCOUNTER — Ambulatory Visit: Payer: BC Managed Care – PPO | Admitting: Neurology

## 2020-07-10 ENCOUNTER — Encounter: Payer: Self-pay | Admitting: Neurology

## 2020-07-10 ENCOUNTER — Other Ambulatory Visit: Payer: Self-pay

## 2020-07-10 ENCOUNTER — Ambulatory Visit: Payer: BC Managed Care – PPO | Admitting: Neurology

## 2020-07-10 VITALS — BP 143/94 | HR 75 | Wt 190.0 lb

## 2020-07-10 DIAGNOSIS — G35 Multiple sclerosis: Secondary | ICD-10-CM | POA: Diagnosis not present

## 2020-07-10 DIAGNOSIS — R200 Anesthesia of skin: Secondary | ICD-10-CM

## 2020-07-10 DIAGNOSIS — Z79899 Other long term (current) drug therapy: Secondary | ICD-10-CM | POA: Diagnosis not present

## 2020-07-10 DIAGNOSIS — E559 Vitamin D deficiency, unspecified: Secondary | ICD-10-CM

## 2020-07-10 DIAGNOSIS — G35D Multiple sclerosis, unspecified: Secondary | ICD-10-CM

## 2020-07-10 MED ORDER — ALPRAZOLAM 0.5 MG PO TABS: ORAL_TABLET | ORAL | 3 refills | Status: DC

## 2020-07-10 NOTE — Progress Notes (Signed)
[facility]  PATIENT: Christopher Santana DOB: September 30, 1975  REFERRING DOCTOR OR PCP:   Seabron Spates DO SOURCE: Patient, notes from primary care, imaging and lab reports, MRI images personally reviewed.  _________________________________   HISTORICAL  CHIEF COMPLAINT:  Chief Complaint  Patient presents with  . Follow-up    RM 12, alone. Last seen 09/28/2019. On Mavenclad for MS. Doing well, no new sx.    HISTORY OF PRESENT ILLNESS:  Christopher Santana is a 45 y.o. man with relapsing remitting MS.  Update 07/10/2020: He took Mavenclad starting Apr 30, 2020 and May 30, 2020 and he tolerated it very well.  He denies any recent exacerbations or new neurologic symptoms.  The leg numbness completely resolved though when stressed or very tired he sometimes notes mild symptoms that improve within a day, especially if he takes an alprazolam for the anxiety.  He also rarely takes for insomnia.       No other symptoms.   Specifically, he denies any difficulties with weakness though he had some late last year.  No difficulties with bladder.  Vision is fine.  He denies much fatigue and sleeps well most nights.   He denies depression.   He has more anxiety when experiencing neurologic symptoms but does not note any most of the time.  He denies issues with cognition.     MS history: He was diagnosed with MS by Dr. Clarisse Gouge around 2008 when he presented with numbness in his entire body.    MRI showed a focus at C2 and some hemispheric foci consistent with MS.    He had a second opinion at [facility] Kathrynn Ducking) who concurred with the diagnosis.   He was placed on Avonex for just a year.  He then switched to Rebif and stopped around 2014.   He stopped by personal choice.   Over the next 2 years he had some fluctuating symptoms especially sensory but no major exacerbation.  In 2021 he had a small exacerbation with numbness.  Specifically there was tingling in the legs, right greater than left..  He  started [facility] January 2022.  Images:  MRI of the brain from 08/31/2019 and compared it with the MRI from 08/18/2012.  The MRI shows a large arachnoid cyst in the left middle fossa.  There are more than a dozen T2/FLAIR hyperintense foci.  Some are in the periventricular white matter and others are in the juxtacortical white matter.  Between 2014 and 2021, about 6 new lesions have developed.    The MRI of the cervical spine also performed 08/31/2019 was compared to an MRI of the cervical spine from 2008.  Both show a focus adjacent to C2.  It is less pronounced on the current MRI than the previous MRI.  Additionally there are multilevel degenerative changes.   REVIEW OF SYSTEMS: Constitutional: No fevers, chills, sweats, or change in appetite Eyes: No visual changes, double vision, eye pain Ear, nose and throat: No hearing loss, ear pain, nasal congestion, sore throat Cardiovascular: No chest pain, palpitations Respiratory: No shortness of breath at rest or with exertion.   No wheezes GastrointestinaI: No nausea, vomiting, diarrhea, abdominal pain, fecal incontinence Genitourinary: No dysuria, urinary retention or frequency.  No nocturia. Musculoskeletal: No neck pain, back pain Integumentary: No rash, pruritus, skin lesions Neurological: as above Psychiatric: No depression at this time.  some anxiety Endocrine: No palpitations, diaphoresis, change in appetite, change in weigh or increased thirst Hematologic/Lymphatic: No anemia, purpura, petechiae. Allergic/Immunologic: No itchy/runny  eyes, nasal congestion, recent allergic reactions, rashes  ALLERGIES: No Known Allergies  HOME MEDICATIONS:  Current Outpatient Medications:  .  cetirizine (ZYRTEC) 10 MG tablet, Take 1 tablet (10 mg total) by mouth daily., Disp: 30 tablet, Rfl: 11 .  fluticasone (FLONASE) 50 MCG/ACT nasal spray, Place 2 sprays into both nostrils daily., Disp: 16 g, Rfl: 6 .  ketoconazole (NIZORAL) 200 MG tablet, As  directed, Disp: 7 tablet, Rfl: 2 .  ALPRAZolam (XANAX) 0.5 MG tablet, TAKE 1 TABLET(0.5 MG) BY MOUTH DAILY AS NEEDED FOR ANXIETY, Disp: 15 tablet, Rfl: 3  PAST MEDICAL HISTORY: No past medical history on file.  PAST SURGICAL HISTORY: Past Surgical History:  Procedure Laterality Date  . CHOLECYSTECTOMY    . KNEE SURGERY Left    around 2011    FAMILY HISTORY: No family history on file.  SOCIAL HISTORY:  Social History   Socioeconomic History  . Marital status: Married    Spouse name: Porfirio Mylar  . Number of children: 0  . Years of education: Not on file  . Highest education level: Not on file  Occupational History  . Not on file  Tobacco Use  . Smoking status: Never Smoker  . Smokeless tobacco: Never Used  Substance and Sexual Activity  . Alcohol use: Yes  . Drug use: No  . Sexual activity: Yes    Partners: Female  Other Topics Concern  . Not on file  Social History Narrative   Right handed    Caffeine use: 3 cups per day   Social Determinants of Health   Financial Resource Strain: Not on file  Food Insecurity: Not on file  Transportation Needs: Not on file  Physical Activity: Not on file  Stress: Not on file  Social Connections: Not on file  Intimate Partner Violence: Not on file     PHYSICAL EXAM  Vitals:   07/10/20 1244  BP: (!) 143/94  Pulse: 75  SpO2: 97%  Weight: 190 lb (86.2 kg)    Body mass index is 28.06 kg/m.  No exam data present   General: The patient is well-developed and well-nourished and in no acute distress .  No rashes.    HEENT:  Head is [place]/AT.  Sclera are anicteric.    Neurologic Exam  Mental status: The patient is alert and oriented x 3 at the time of the examination. The patient has apparent normal recent and remote memory, with an apparently normal attention span and concentration ability.   Speech is normal.  Cranial nerves: Extraocular movements are full.  Facial strength and sensation was normal.  Hearing was  normal.  Motor:  Muscle bulk is normal.   Tone is normal. Strength is  5 / 5 in all 4 extremities.   Sensory: Sensory testing is intact to pinprick, soft touch and vibration sensation in all 4 extremities.  Coordination: Cerebellar testing reveals good finger-nose-finger and heel-to-shin bilaterally.  Gait and station: Station is normal.   Gait is normal. Tandem gait is normal. Romberg is negative.   Reflexes: Deep tendon reflexes are symmetric and normal bilaterally.        DIAGNOSTIC DATA (LABS, IMAGING, TESTING) - I reviewed patient records, labs, notes, testing and imaging myself where available.  Lab Results  Component Value Date   WBC 5.3 09/28/2019   HGB 13.6 09/28/2019   HCT 42.9 09/28/2019   MCV 80 09/28/2019   PLT 288 09/28/2019      Component Value Date/Time   NA 139 09/28/2019 1431  K 4.1 09/28/2019 1431   CL 100 09/28/2019 1431   CO2 26 09/28/2019 1431   GLUCOSE 87 09/28/2019 1431   GLUCOSE 82 07/08/2019 1343   BUN 14 09/28/2019 1431   CREATININE 0.97 09/28/2019 1431   CALCIUM 9.9 09/28/2019 1431   PROT 7.2 09/28/2019 1431   ALBUMIN 4.8 09/28/2019 1431   AST 38 09/28/2019 1431   ALT 32 09/28/2019 1431   ALKPHOS 48 09/28/2019 1431   BILITOT 0.4 09/28/2019 1431   GFRNONAA 94 09/28/2019 1431   GFRAA 109 09/28/2019 1431   No results found for: CHOL, HDL, LDLCALC, LDLDIRECT, TRIG, CHOLHDL No results found for: YDXA1O Lab Results  Component Value Date   VITAMINB12 330 07/08/2019   Lab Results  Component Value Date   TSH 1.02 07/08/2019       ASSESSMENT AND PLAN  MS (multiple sclerosis) (HCC) - Plan: CBC with Differential/Platelet, Hepatic function panel  High risk medication use - Plan: CBC with Differential/Platelet, Hepatic function panel  Numbness  Vitamin D deficiency  1.   He tolerated his first course of Mavenclad well.  Check labs today   If lymphocytes are very low (0.2 or lower) then prescribe Valtrex x 3 months 2.   He will have  his second year of Mavenclad around Jan/Feb 2023 3.   Stay active. 4.   rtc 4 months or sooner if new or worsening issues   Susi Goslin A. Epimenio Foot, MD, [facility]: [place] Rhonda W 07/10/2020, 1:37 PM Certified in Neurology, Clinical Neurophysiology, Sleep Medicine and Neuroimaging  [facility] [address], [address] [place], [place] [address] [phone]

## 2020-07-11 LAB — CBC WITH DIFFERENTIAL/PLATELET
Basophils Absolute: 0 10*3/uL (ref 0.0–0.2)
Basos: 1 %
EOS (ABSOLUTE): 0.1 10*3/uL (ref 0.0–0.4)
Eos: 4 %
Hematocrit: 43.1 % (ref 37.5–51.0)
Hemoglobin: 13.4 g/dL (ref 13.0–17.7)
Immature Grans (Abs): 0 10*3/uL (ref 0.0–0.1)
Immature Granulocytes: 0 %
Lymphocytes Absolute: 0.9 10*3/uL (ref 0.7–3.1)
Lymphs: 25 %
MCH: 25.1 pg — ABNORMAL LOW (ref 26.6–33.0)
MCHC: 31.1 g/dL — ABNORMAL LOW (ref 31.5–35.7)
MCV: 81 fL (ref 79–97)
Monocytes Absolute: 0.4 10*3/uL (ref 0.1–0.9)
Monocytes: 10 %
Neutrophils Absolute: 2.2 10*3/uL (ref 1.4–7.0)
Neutrophils: 60 %
Platelets: 213 10*3/uL (ref 150–450)
RBC: 5.33 x10E6/uL (ref 4.14–5.80)
RDW: 15.1 % (ref 11.6–15.4)
WBC: 3.7 10*3/uL (ref 3.4–10.8)

## 2020-07-11 LAB — HEPATIC FUNCTION PANEL
ALT: 28 IU/L (ref 0–44)
AST: 30 IU/L (ref 0–40)
Albumin: 4.7 g/dL (ref 4.0–5.0)
Alkaline Phosphatase: 50 IU/L (ref 44–121)
Bilirubin Total: 0.5 mg/dL (ref 0.0–1.2)
Bilirubin, Direct: 0.15 mg/dL (ref 0.00–0.40)
Total Protein: 7 g/dL (ref 6.0–8.5)

## 2020-10-20 DIAGNOSIS — Z20822 Contact with and (suspected) exposure to covid-19: Secondary | ICD-10-CM | POA: Diagnosis not present

## 2020-11-22 ENCOUNTER — Ambulatory Visit: Payer: BC Managed Care – PPO | Admitting: Neurology

## 2021-01-04 ENCOUNTER — Encounter: Payer: Self-pay | Admitting: Neurology

## 2021-01-04 ENCOUNTER — Ambulatory Visit: Payer: BC Managed Care – PPO | Admitting: Neurology

## 2021-01-04 VITALS — BP 154/101 | HR 69 | Ht 69.0 in | Wt 186.0 lb

## 2021-01-04 DIAGNOSIS — R200 Anesthesia of skin: Secondary | ICD-10-CM

## 2021-01-04 DIAGNOSIS — Z79899 Other long term (current) drug therapy: Secondary | ICD-10-CM | POA: Diagnosis not present

## 2021-01-04 DIAGNOSIS — R030 Elevated blood-pressure reading, without diagnosis of hypertension: Secondary | ICD-10-CM | POA: Diagnosis not present

## 2021-01-04 DIAGNOSIS — G35 Multiple sclerosis: Secondary | ICD-10-CM | POA: Diagnosis not present

## 2021-01-04 DIAGNOSIS — F419 Anxiety disorder, unspecified: Secondary | ICD-10-CM | POA: Insufficient documentation

## 2021-01-04 MED ORDER — ALPRAZOLAM 0.5 MG PO TABS: ORAL_TABLET | ORAL | 3 refills | Status: DC

## 2021-01-04 NOTE — Progress Notes (Signed)
[facility]  PATIENT: Christopher Santana DOB: 1975-04-11  REFERRING DOCTOR OR PCP:   Seabron Spates DO SOURCE: Patient, notes from primary care, imaging and lab reports, MRI images personally reviewed.  _________________________________   HISTORICAL  CHIEF COMPLAINT:  Chief Complaint  Patient presents with   Follow-up    RM 2, alone. Last seen 07/10/20. Ms- Olin Pia (due for 2nd yr Jan/Feb 2023). No new sx.    HISTORY OF PRESENT ILLNESS:  Christopher Santana is a 45 y.o. man with relapsing remitting MS.  Update 9/29//2022: He took Mavenclad starting Apr 30, 2020 and May 30, 2020 and he tolerated it very well.  He denies any recent exacerbations or new neurologic symptoms.  Gait and balance are doing well.    No longer has numbness.    He also rarely takes for insomnia.       No other symptoms.   Specifically, he denies any difficulties with weakness though he had some late last year.  No difficulties with bladder.  Vision is fine.  He denies much fatigue and sleeps well most nights.   He denies depression.   He has insome anxiety, helped by xanax.  He denies issues with cognition.     MS history: He was diagnosed with MS by Dr. Clarisse Gouge around 2008 when he presented with numbness in his entire body.    MRI showed a focus at C2 and some hemispheric foci consistent with MS.    He had a second opinion at [facility] Kathrynn Ducking) who concurred with the diagnosis.   He was placed on Avonex for just a year.  He then switched to Rebif and stopped around 2014.   He stopped by personal choice.   Over the next 2 years he had some fluctuating symptoms especially sensory but no major exacerbation.  In 2021 he had a small exacerbation with numbness.  Specifically there was tingling in the legs, right greater than left..  He started [facility] January 2022.  Images:  MRI of the brain from 08/31/2019 and compared it with the MRI from 08/18/2012.  The MRI shows a large arachnoid cyst in the left  middle fossa.  There are more than a dozen T2/FLAIR hyperintense foci.  Some are in the periventricular white matter and others are in the juxtacortical white matter.  Between 2014 and 2021, about 6 new lesions have developed.    The MRI of the cervical spine also performed 08/31/2019 was compared to an MRI of the cervical spine from 2008.  Both show a focus adjacent to C2.  It is less pronounced on the current MRI than the previous MRI.  Additionally there are multilevel degenerative changes.   REVIEW OF SYSTEMS: Constitutional: No fevers, chills, sweats, or change in appetite Eyes: No visual changes, double vision, eye pain Ear, nose and throat: No hearing loss, ear pain, nasal congestion, sore throat Cardiovascular: No chest pain, palpitations Respiratory:  No shortness of breath at rest or with exertion.   No wheezes GastrointestinaI: No nausea, vomiting, diarrhea, abdominal pain, fecal incontinence Genitourinary:  No dysuria, urinary retention or frequency.  No nocturia. Musculoskeletal:  No neck pain, back pain Integumentary: No rash, pruritus, skin lesions Neurological: as above Psychiatric: No depression at this time.  some anxiety Endocrine: No palpitations, diaphoresis, change in appetite, change in weigh or increased thirst Hematologic/Lymphatic:  No anemia, purpura, petechiae. Allergic/Immunologic: No itchy/runny eyes, nasal congestion, recent allergic reactions, rashes  ALLERGIES: No Known Allergies  HOME MEDICATIONS:  Current Outpatient Medications:  cetirizine (ZYRTEC) 10 MG tablet, Take 1 tablet (10 mg total) by mouth daily., Disp: 30 tablet, Rfl: 11   fluticasone (FLONASE) 50 MCG/ACT nasal spray, Place 2 sprays into both nostrils daily., Disp: 16 g, Rfl: 6   ketoconazole (NIZORAL) 200 MG tablet, As directed, Disp: 7 tablet, Rfl: 2   ALPRAZolam (XANAX) 0.5 MG tablet, TAKE 1 TABLET(0.5 MG) BY MOUTH DAILY AS NEEDED FOR ANXIETY, Disp: 15 tablet, Rfl: 3  PAST MEDICAL  HISTORY: History reviewed. No pertinent past medical history.  PAST SURGICAL HISTORY: Past Surgical History:  Procedure Laterality Date   CHOLECYSTECTOMY     KNEE SURGERY Left    around 2011    FAMILY HISTORY: History reviewed. No pertinent family history.  SOCIAL HISTORY:  Social History   Socioeconomic History   Marital status: Married    Spouse name: Porfirio Mylar   Number of children: 0   Years of education: Not on file   Highest education level: Not on file  Occupational History   Not on file  Tobacco Use   Smoking status: Never   Smokeless tobacco: Never  Substance and Sexual Activity   Alcohol use: Yes   Drug use: No   Sexual activity: Yes    Partners: Female  Other Topics Concern   Not on file  Social History Narrative   Right handed    Caffeine use: 3 cups per day   Social Determinants of Health   Financial Resource Strain: Not on file  Food Insecurity: Not on file  Transportation Needs: Not on file  Physical Activity: Not on file  Stress: Not on file  Social Connections: Not on file  Intimate Partner Violence: Not on file     PHYSICAL EXAM  Vitals:   01/04/21 1033  BP: (!) 154/101  Pulse: 69  Weight: 186 lb (84.4 kg)  Height: 5\' 9"  (1.753 m)    Body mass index is 27.47 kg/m.  No results found.   General: The patient is well-developed and well-nourished and in no acute distress .  No rashes.    HEENT:  Head is [place]/AT.  Sclera are anicteric.    Neurologic Exam  Mental status: The patient is alert and oriented x 3 at the time of the examination. The patient has apparent normal recent and remote memory, with an apparently normal attention span and concentration ability.   Speech is normal.  Cranial nerves: Extraocular movements are full.  Facial strength and sensation was normal.  Hearing was normal.  Motor:  Muscle bulk is normal.   Tone is normal. Strength is  5 / 5 in all 4 extremities.   Sensory: Sensory testing is intact to pinprick,  soft touch and vibration sensation in all 4 extremities.  Coordination: Cerebellar testing reveals good finger-nose-finger and heel-to-shin bilaterally.  Gait and station: Station is normal.   Gait is normal. Tandem gait is normal. Romberg is negative.   Reflexes: Deep tendon reflexes are symmetric and normal bilaterally.        DIAGNOSTIC DATA (LABS, IMAGING, TESTING) - I reviewed patient records, labs, notes, testing and imaging myself where available.  Lab Results  Component Value Date   WBC 3.7 07/10/2020   HGB 13.4 07/10/2020   HCT 43.1 07/10/2020   MCV 81 07/10/2020   PLT 213 07/10/2020      Component Value Date/Time   NA 139 09/28/2019 1431   K 4.1 09/28/2019 1431   CL 100 09/28/2019 1431   CO2 26 09/28/2019 1431   GLUCOSE  87 09/28/2019 1431   GLUCOSE 82 07/08/2019 1343   BUN 14 09/28/2019 1431   CREATININE 0.97 09/28/2019 1431   CALCIUM 9.9 09/28/2019 1431   PROT 7.0 07/10/2020 1346   ALBUMIN 4.7 07/10/2020 1346   AST 30 07/10/2020 1346   ALT 28 07/10/2020 1346   ALKPHOS 50 07/10/2020 1346   BILITOT 0.5 07/10/2020 1346   GFRNONAA 94 09/28/2019 1431   GFRAA 109 09/28/2019 1431   No results found for: CHOL, HDL, LDLCALC, LDLDIRECT, TRIG, CHOLHDL No results found for: XLKG4W Lab Results  Component Value Date   VITAMINB12 330 07/08/2019   Lab Results  Component Value Date   TSH 1.02 07/08/2019       ASSESSMENT AND PLAN  MS (multiple sclerosis) (HCC) - Plan: Hepatitis B core antibody, total, Hepatitis B surface antigen, HIV Antibody (routine testing w rflx), QuantiFERON-TB Gold Plus, Hepatitis B surface antibody,qualitative, Hepatitis C antibody, Ferritin, CBC with Differential/Platelet, Comprehensive metabolic panel, Lipid Panel  High risk medication use - Plan: Hepatitis B core antibody, total, Hepatitis B surface antigen, HIV Antibody (routine testing w rflx), QuantiFERON-TB Gold Plus, Hepatitis B surface antibody,qualitative, Hepatitis C antibody,  Ferritin, CBC with Differential/Platelet, Comprehensive metabolic panel, Lipid Panel  Numbness  Elevated BP without diagnosis of hypertension  Anxiety  1.   He tolerated his first course of Mavenclad well.  He will be due the second year in December.  We will check blood work for chronic infections and other lab work today.   2.   He will have his second year of Mavenclad around late December 2022 and late January 2023.  I will see him in mid February 3.   Stay active.  Renew as needed Xanax. 4.   rtc 5 months or sooner if new or worsening issues   Tramayne Sebesta A. Epimenio Foot, MD, [facility] 01/04/2021, 12:11 PM Certified in Neurology, Clinical Neurophysiology, Sleep Medicine and Neuroimaging  [facility] [address] [place], [place] [address] [phone]

## 2021-01-07 LAB — COMPREHENSIVE METABOLIC PANEL
ALT: 28 IU/L (ref 0–44)
AST: 26 IU/L (ref 0–40)
Albumin/Globulin Ratio: 2 (ref 1.2–2.2)
Albumin: 4.7 g/dL (ref 4.0–5.0)
Alkaline Phosphatase: 56 IU/L (ref 44–121)
BUN/Creatinine Ratio: 26 — ABNORMAL HIGH (ref 9–20)
BUN: 26 mg/dL — ABNORMAL HIGH (ref 6–24)
Bilirubin Total: 0.3 mg/dL (ref 0.0–1.2)
CO2: 23 mmol/L (ref 20–29)
Calcium: 9.7 mg/dL (ref 8.7–10.2)
Chloride: 103 mmol/L (ref 96–106)
Creatinine, Ser: 1 mg/dL (ref 0.76–1.27)
Globulin, Total: 2.4 g/dL (ref 1.5–4.5)
Glucose: 93 mg/dL (ref 70–99)
Potassium: 5.3 mmol/L — ABNORMAL HIGH (ref 3.5–5.2)
Sodium: 140 mmol/L (ref 134–144)
Total Protein: 7.1 g/dL (ref 6.0–8.5)
eGFR: 93 mL/min/{1.73_m2} (ref >59)

## 2021-01-07 LAB — CBC WITH DIFFERENTIAL/PLATELET
Basophils Absolute: 0.1 10*3/uL (ref 0.0–0.2)
Basos: 2 %
EOS (ABSOLUTE): 0.1 10*3/uL (ref 0.0–0.4)
Eos: 2 %
Hematocrit: 42.9 % (ref 37.5–51.0)
Hemoglobin: 13.8 g/dL (ref 13.0–17.7)
Immature Grans (Abs): 0 10*3/uL (ref 0.0–0.1)
Immature Granulocytes: 0 %
Lymphocytes Absolute: 1 10*3/uL (ref 0.7–3.1)
Lymphs: 26 %
MCH: 25.9 pg — ABNORMAL LOW (ref 26.6–33.0)
MCHC: 32.2 g/dL (ref 31.5–35.7)
MCV: 81 fL (ref 79–97)
Monocytes Absolute: 0.3 10*3/uL (ref 0.1–0.9)
Monocytes: 6 %
Neutrophils Absolute: 2.5 10*3/uL (ref 1.4–7.0)
Neutrophils: 64 %
Platelets: 227 10*3/uL (ref 150–450)
RBC: 5.33 x10E6/uL (ref 4.14–5.80)
RDW: 14.3 % (ref 11.6–15.4)
WBC: 3.9 10*3/uL (ref 3.4–10.8)

## 2021-01-07 LAB — HEPATITIS C ANTIBODY: Hep C Virus Ab: <0.1 s/co ratio (ref 0.0–0.9)

## 2021-01-07 LAB — LIPID PANEL
Chol/HDL Ratio: 2.1 ratio (ref 0.0–5.0)
Cholesterol, Total: 131 mg/dL (ref 100–199)
HDL: 63 mg/dL (ref >39)
LDL Chol Calc (NIH): 59 mg/dL (ref 0–99)
Triglycerides: 32 mg/dL (ref 0–149)
VLDL Cholesterol Cal: 9 mg/dL (ref 5–40)

## 2021-01-07 LAB — QUANTIFERON-TB GOLD PLUS
QuantiFERON Mitogen Value: >10 IU/mL
QuantiFERON Nil Value: 0.01 IU/mL
QuantiFERON TB1 Ag Value: 0.02 IU/mL
QuantiFERON TB2 Ag Value: 0.01 IU/mL
QuantiFERON-TB Gold Plus: NEGATIVE

## 2021-01-07 LAB — HIV ANTIBODY (ROUTINE TESTING W REFLEX): HIV Screen 4th Generation wRfx: NONREACTIVE

## 2021-01-07 LAB — HEPATITIS B SURFACE ANTIGEN: Hepatitis B Surface Ag: NEGATIVE

## 2021-01-07 LAB — HEPATITIS B SURFACE ANTIBODY,QUALITATIVE: Hep B Surface Ab, Qual: NONREACTIVE

## 2021-01-07 LAB — HEPATITIS B CORE ANTIBODY, TOTAL: Hep B Core Total Ab: NEGATIVE

## 2021-01-07 LAB — FERRITIN: Ferritin: 266 ng/mL (ref 30–400)

## 2021-01-09 ENCOUNTER — Telehealth: Payer: Self-pay

## 2021-01-09 NOTE — Telephone Encounter (Signed)
-----   Message from Asa Lente, MD sent at 01/08/2021  3:59 PM EDT ----- Labs are fine, we can send in the year two Mavenclad form

## 2021-01-09 NOTE — Telephone Encounter (Signed)
I called patient. No answer, left a detailed message per DPR, advising him that his labs were find and we have sent in the [facility] start form. He should not take the [facility] until late December 2022. I asked patient to call me back with questions or concerns.  Mavenclad start form faxed to [employer]. Received a receipt of confirmation.  PA for [facility] completed via CMM. Key: [id number]. Sent to [employer]. Should have a determination within 3-5 business days.

## 2021-01-11 ENCOUNTER — Ambulatory Visit: Payer: BC Managed Care – PPO | Admitting: Podiatry

## 2021-01-11 ENCOUNTER — Encounter: Payer: Self-pay | Admitting: Podiatry

## 2021-01-11 ENCOUNTER — Other Ambulatory Visit: Payer: Self-pay

## 2021-01-11 DIAGNOSIS — M21611 Bunion of right foot: Secondary | ICD-10-CM | POA: Diagnosis not present

## 2021-01-11 DIAGNOSIS — Q828 Other specified congenital malformations of skin: Secondary | ICD-10-CM

## 2021-01-11 NOTE — Progress Notes (Signed)
  Subjective:  Patient ID: Christopher Santana, male    DOB: April 06, 1976,   MRN: [id number]  Chief Complaint  Patient presents with   Plantar Warts    Right foot- pt states its has been going on for a long tie- lesion has become very uncomfortable to apply pressure when walking- tried freezing it at home but no improvement     45 y.o. male presents for lesion on bottom of right great toe that has been there four a couple years. States he has tried freezing treatments at home with no relief. Also wanting to know about his bunion.  . Denies any other pedal complaints. Denies n/v/f/c.   History reviewed. No pertinent past medical history.  Objective:  Physical Exam: Vascular: DP/PT pulses 2/4 bilateral. CFT <3 seconds. Normal hair growth on digits. No edema.  Skin. No lacerations or abrasions bilateral feet. Hyperkeratotic cored lesion noted to plantar right hallux.  Musculoskeletal: MMT 5/5 bilateral lower extremities in DF, PF, Inversion and Eversion. Deceased ROM in DF of ankle joint. Tender to palpation of lesion. HAV deformity noted to right foot.  Neurological: Sensation intact to light touch.   Assessment:   1. Porokeratosis   2. Bunion, right foot      Plan:  Patient was evaluated and treated and all questions answered. -Discussed porokeratosis with patient and treatment options.  -Hyperkeratotic tissue was debrided with chisel without incident.  -Applied salycylic acid treatment to area with dressing. Advised to remove bandaging tomorrow.  -Encouraged daily moisturizing -Discussed use of pumice stone -Advised good supportive shoes and inserts -Xrays reviewed -Discussed HAV and treatment options;conservative and surgical management; risks, benefits, alternatives discussed. All patient's questions answered. -Discussed padding and wide shoe gear.   -Recommend continue with good supportive shoes and inserts.  -Discussed potential  -Patient to return to office as needed or sooner if  condition worsens.    Louann Sjogren, DPM

## 2021-01-16 NOTE — Telephone Encounter (Signed)
[employer] denied coverage of mavenclad. I called [employer]. Patient will need to consent for the appeal. This will be faxed to me.

## 2021-01-17 NOTE — Telephone Encounter (Signed)
I called [employer] again because I have not received the denial letter yet. It was finally faxed to me.  Patient will need to sign the appeal consent for mavenclad.  I called patient to discuss. No answer, left a message asking him to call me back. I will also send him a mychart message.

## 2021-01-17 NOTE — Telephone Encounter (Signed)
Faxed appeal and consent to [employer]. Received a receipt of confirmation.

## 2021-01-22 NOTE — Telephone Encounter (Signed)
[employer] also denied the appeal for mavenclad.  I called [employer], spoke with Liborio Nixon. They will contact patient to discuss the patient assistance program. Patient is not due to start mavenclad until around 03/27/2021.

## 2021-04-05 ENCOUNTER — Encounter: Payer: Self-pay | Admitting: Neurology

## 2021-04-05 NOTE — Telephone Encounter (Signed)
Called and spoke w/ Candise Bowens at [employer]. They confirmed they received PA/appeal denial previously sent. Unable to see if he is approved or not. She transferred me to pt care coordinator, Morrie Sheldon. She states they are in process of reviewing pt assistance program with him.  Aware I faxed back form clearing him to start therapy immediately just now. Fax: [phone]. Received fax confirmation. I also provided verbal for him to start therapy. They will call back if anything else is needed.

## 2021-04-24 NOTE — Telephone Encounter (Signed)
I called patient to discuss if he has received his mavenclad and started taking it. No answer, left a message asking him to call me back.

## 2021-04-30 NOTE — Telephone Encounter (Signed)
Patient has not returned my call nor mychart message regarding his mavenclad status.  I called [employer]. They spoke with patien ton 04/16/2021 an advised him that he will need to complete the patient assistance application. He declined to do this verbally with them and requested instead that this be sent to him to complete. [employer] has not received his completed application yet.

## 2021-05-21 NOTE — Telephone Encounter (Signed)
I called [employer] and spoke with Morrie Sheldon. The patient assistance application was received and they are reviewing him for qualification in the PAP for mavenclad.

## 2021-05-22 ENCOUNTER — Encounter: Payer: Self-pay | Admitting: Neurology

## 2021-05-24 ENCOUNTER — Ambulatory Visit: Payer: BC Managed Care – PPO | Admitting: Neurology

## 2021-05-31 NOTE — Telephone Encounter (Signed)
I called [employer] and spoke with Janett Billow. Patient has been approved for [facility] assistance and the pharmacy will be reaching out to the patient shortly.

## 2021-05-31 NOTE — Telephone Encounter (Signed)
Received fax from [place] that patient's PAP application was approved from 05/31/2021-05/30/2022.

## 2021-06-18 ENCOUNTER — Other Ambulatory Visit: Payer: Self-pay

## 2021-06-18 MED ORDER — ALPRAZOLAM 0.5 MG PO TABS: ORAL_TABLET | ORAL | 3 refills | Status: DC

## 2021-06-18 NOTE — Telephone Encounter (Signed)
Patient requested xanax refill. Patient is due for a refill on xanax. Patient is up to date on his appointments. [place] Controlled Substance Registry checked and is appropriate. ? ?

## 2021-08-27 ENCOUNTER — Encounter: Payer: Self-pay | Admitting: Neurology

## 2021-08-27 ENCOUNTER — Ambulatory Visit: Payer: BC Managed Care – PPO | Admitting: Neurology

## 2021-08-27 VITALS — BP 154/100 | HR 94 | Ht 69.0 in | Wt 198.0 lb

## 2021-08-27 DIAGNOSIS — R200 Anesthesia of skin: Secondary | ICD-10-CM | POA: Diagnosis not present

## 2021-08-27 DIAGNOSIS — G35 Multiple sclerosis: Secondary | ICD-10-CM

## 2021-08-27 DIAGNOSIS — Z79899 Other long term (current) drug therapy: Secondary | ICD-10-CM | POA: Diagnosis not present

## 2021-08-27 NOTE — Progress Notes (Signed)
[facility]  PATIENT: Christopher Santana DOB: Sep 17, 1975  REFERRING DOCTOR OR PCP:   Seabron Spates DO SOURCE: Patient, notes from primary care, imaging and lab reports, MRI images personally reviewed.  _________________________________   HISTORICAL  CHIEF COMPLAINT:  Chief Complaint  Patient presents with   Follow-up    Rm 1, alone. Here to f/u for MS, on Mavenclad, yr 2. Doing well. No concerns.     HISTORY OF PRESENT ILLNESS:  Christopher Santana is a 46 y.o. man with relapsing remitting MS.  Update 9/29//2022: He took [facility] for cycle starting Apr 30, 2020 and May 30, 2020 and he tolerated it very well.  He had a second cycle in March and April 2023 (was delayed due to insurance issues) he also tolerated that course very well.  He denies any recent exacerbations or new neurologic symptoms.  Lymphocyte count 01/04/2021 was normal at 1.0   Gait and balance are doing well.  If not resting well, he sometimes notes numbness.    He also rarely takes for insomnia.       No other symptoms.   Specifically, he denies any difficulties with weakness though he had some late last year.  No difficulties with bladder.  Vision is fine.  He denies much fatigue and sleeps well most nights.   He denies depression.   He has some anxiety, helped by low dose of xanax.  He denies issues with cognition.     MS history: He was diagnosed with MS by Dr. Clarisse Gouge around 2008 when he presented with numbness in his entire body.    MRI showed a focus at C2 and some hemispheric foci consistent with MS.    He had a second opinion at [facility] Kathrynn Ducking) who concurred with the diagnosis.   He was placed on Avonex for just a year.  He then switched to Rebif and stopped around 2014.   He stopped by personal choice.   Over the next 2 years he had some fluctuating symptoms especially sensory but no major exacerbation.  In 2021 he had a small exacerbation with numbness.  Specifically there was tingling in the  legs, right greater than left..  He started [facility] January 2022.  Images:  MRI of the brain from 08/31/2019 and compared it with the MRI from 08/18/2012.  The MRI shows a large arachnoid cyst in the left middle fossa.  There are more than a dozen T2/FLAIR hyperintense foci.  Some are in the periventricular white matter and others are in the juxtacortical white matter.  Between 2014 and 2021, about 6 new lesions have developed.    The MRI of the cervical spine also performed 08/31/2019 was compared to an MRI of the cervical spine from 2008.  Both show a focus adjacent to C2.  It is less pronounced on the current MRI than the previous MRI.  Additionally there are multilevel degenerative changes.   REVIEW OF SYSTEMS: Constitutional: No fevers, chills, sweats, or change in appetite Eyes: No visual changes, double vision, eye pain Ear, nose and throat: No hearing loss, ear pain, nasal congestion, sore throat Cardiovascular: No chest pain, palpitations Respiratory:  No shortness of breath at rest or with exertion.   No wheezes GastrointestinaI: No nausea, vomiting, diarrhea, abdominal pain, fecal incontinence Genitourinary:  No dysuria, urinary retention or frequency.  No nocturia. Musculoskeletal:  No neck pain, back pain Integumentary: No rash, pruritus, skin lesions Neurological: as above Psychiatric: No depression at this time.  some anxiety Endocrine:  No palpitations, diaphoresis, change in appetite, change in weigh or increased thirst Hematologic/Lymphatic:  No anemia, purpura, petechiae. Allergic/Immunologic: No itchy/runny eyes, nasal congestion, recent allergic reactions, rashes  ALLERGIES: No Known Allergies  HOME MEDICATIONS:  Current Outpatient Medications:    ALPRAZolam (XANAX) 0.5 MG tablet, TAKE 1 TABLET(0.5 MG) BY MOUTH DAILY AS NEEDED FOR ANXIETY, Disp: 15 tablet, Rfl: 3   cetirizine (ZYRTEC) 10 MG tablet, Take 1 tablet (10 mg total) by mouth daily., Disp: 30 tablet, Rfl:  11   fluticasone (FLONASE) 50 MCG/ACT nasal spray, Place 2 sprays into both nostrils daily., Disp: 16 g, Rfl: 6   ketoconazole (NIZORAL) 200 MG tablet, As directed, Disp: 7 tablet, Rfl: 2  PAST MEDICAL HISTORY: History reviewed. No pertinent past medical history.  PAST SURGICAL HISTORY: Past Surgical History:  Procedure Laterality Date   CHOLECYSTECTOMY     KNEE SURGERY Left    around 2011    FAMILY HISTORY: History reviewed. No pertinent family history.  SOCIAL HISTORY:  Social History   Socioeconomic History   Marital status: Married    Spouse name: Asencion Partridge   Number of children: 0   Years of education: Not on file   Highest education level: Not on file  Occupational History   Not on file  Tobacco Use   Smoking status: Never   Smokeless tobacco: Never  Substance and Sexual Activity   Alcohol use: Yes   Drug use: No   Sexual activity: Yes    Partners: Female  Other Topics Concern   Not on file  Social History Narrative   Right handed    Caffeine use: 3 cups per day   Social Determinants of Health   Financial Resource Strain: Not on file  Food Insecurity: Not on file  Transportation Needs: Not on file  Physical Activity: Not on file  Stress: Not on file  Social Connections: Not on file  Intimate Partner Violence: Not on file     PHYSICAL EXAM  Vitals:   08/27/21 1307  BP: (!) 154/100  Pulse: 94  Weight: 198 lb (89.8 kg)  Height: 5\' 9"  (QA348G m)    Body mass index is 29.24 kg/m.  No results found.   General: The patient is well-developed and well-nourished and in no acute distress .  No rashes.    HEENT:  Head is [place]/AT.  Sclera are anicteric.    Neurologic Exam  Mental status: The patient is alert and oriented x 3 at the time of the examination. The patient has apparent normal recent and remote memory, with an apparently normal attention span and concentration ability.   Speech is normal.  Cranial nerves: Extraocular movements are full.   Facial strength and sensation was normal.  Hearing was normal.  Motor:  Muscle bulk is normal.   Tone is normal. Strength is  5 / 5 in all 4 extremities.   Sensory: Sensory testing is intact to pinprick, soft touch and vibration sensation in all 4 extremities.  Coordination: Cerebellar testing reveals good finger-nose-finger and heel-to-shin bilaterally.  Gait and station: Station is normal.  Gait and tandem gait are normal.  Romberg is negative..   Reflexes: Deep tendon reflexes are symmetric and normal bilaterally.        DIAGNOSTIC DATA (LABS, IMAGING, TESTING) - I reviewed patient records, labs, notes, testing and imaging myself where available.  Lab Results  Component Value Date   WBC 3.9 01/04/2021   HGB 13.8 01/04/2021   HCT 42.9 01/04/2021   MCV  81 01/04/2021   PLT 227 01/04/2021      Component Value Date/Time   NA 140 01/04/2021 1126   K 5.3 (H) 01/04/2021 1126   CL 103 01/04/2021 1126   CO2 23 01/04/2021 1126   GLUCOSE 93 01/04/2021 1126   GLUCOSE 82 07/08/2019 1343   BUN 26 (H) 01/04/2021 1126   CREATININE 1.00 01/04/2021 1126   CALCIUM 9.7 01/04/2021 1126   PROT 7.1 01/04/2021 1126   ALBUMIN 4.7 01/04/2021 1126   AST 26 01/04/2021 1126   ALT 28 01/04/2021 1126   ALKPHOS 56 01/04/2021 1126   BILITOT 0.3 01/04/2021 1126   GFRNONAA 94 09/28/2019 1431   GFRAA 109 09/28/2019 1431   Lab Results  Component Value Date   CHOL 131 01/04/2021   HDL 63 01/04/2021   LDLCALC 59 01/04/2021   TRIG 32 01/04/2021   CHOLHDL 2.1 01/04/2021   No results found for: HGBA1C Lab Results  Component Value Date   VITAMINB12 330 07/08/2019   Lab Results  Component Value Date   TSH 1.02 07/08/2019       ASSESSMENT AND PLAN  MS (multiple sclerosis) ([place]) - Plan: CBC with Differential/Platelet, Hepatic function panel, MR BRAIN W WO CONTRAST, MR CERVICAL SPINE W WO CONTRAST  High risk medication use - Plan: CBC with Differential/Platelet, Hepatic function  panel  Numbness - Plan: MR BRAIN W WO CONTRAST, MR CERVICAL SPINE W WO CONTRAST  1.   He has tolerated the first and second year of [place] without difficulty.  We are about 2 months out from his last dose of Mavenclad.  I will check CBC with differential and liver function test.  Additionally we will check an MRI of the brain and cervical spine to determine if he is having subclinical progression.  We discussed that if he does have breakthrough activity we will need to consider adding a different disease modifying therapy. 2.    Stay active.  Continue as needed Xanax at bedtime. 3.    Stay active and exercise as tolerated. 4.   rtc 5 months or sooner if new or worsening issues   Henley Boettner A. Felecia Shelling, MD, [facility] [id number], [id number] PM Certified in Neurology, Clinical Neurophysiology, Sleep Medicine and Neuroimaging  [facility] [address], [place], [place] [address] [phone]

## 2021-08-28 LAB — CBC WITH DIFFERENTIAL/PLATELET
Basophils Absolute: 0 10*3/uL (ref 0.0–0.2)
Basos: 1 %
EOS (ABSOLUTE): 0.1 10*3/uL (ref 0.0–0.4)
Eos: 3 %
Hematocrit: 41.1 % (ref 37.5–51.0)
Hemoglobin: 13.5 g/dL (ref 13.0–17.7)
Immature Grans (Abs): 0 10*3/uL (ref 0.0–0.1)
Immature Granulocytes: 1 %
Lymphocytes Absolute: 0.6 10*3/uL — ABNORMAL LOW (ref 0.7–3.1)
Lymphs: 13 %
MCH: 26.7 pg (ref 26.6–33.0)
MCHC: 32.8 g/dL (ref 31.5–35.7)
MCV: 81 fL (ref 79–97)
Monocytes Absolute: 0.4 10*3/uL (ref 0.1–0.9)
Monocytes: 10 %
Neutrophils Absolute: 3.2 10*3/uL (ref 1.4–7.0)
Neutrophils: 72 %
Platelets: 207 10*3/uL (ref 150–450)
RBC: 5.06 x10E6/uL (ref 4.14–5.80)
RDW: 15.6 % — ABNORMAL HIGH (ref 11.6–15.4)
WBC: 4.4 10*3/uL (ref 3.4–10.8)

## 2021-08-28 LAB — HEPATIC FUNCTION PANEL
ALT: 49 IU/L — ABNORMAL HIGH (ref 0–44)
AST: 47 IU/L — ABNORMAL HIGH (ref 0–40)
Albumin: 4.6 g/dL (ref 4.0–5.0)
Alkaline Phosphatase: 60 IU/L (ref 44–121)
Bilirubin Total: 0.6 mg/dL (ref 0.0–1.2)
Bilirubin, Direct: 0.19 mg/dL (ref 0.00–0.40)
Total Protein: 6.8 g/dL (ref 6.0–8.5)

## 2021-12-29 IMAGING — MR MR CERVICAL SPINE WO/W CM
4 of 8 series · 22 of 48 positions shown · IV contrast (multihance)
Comparison: Head MRI 08/17/2012 and cervical spine MRI 06/05/2006

CLINICAL DATA: Multiple sclerosis. New numbness/tingling in the
right foot.

EXAM:
MRI HEAD WITHOUT AND WITH CONTRAST
MRI CERVICAL SPINE WITHOUT AND WITH CONTRAST
TECHNIQUE: Multiplanar, multiecho pulse sequences of the brain and surrounding
structures, and cervical spine, to include the craniocervical
junction and cervicothoracic junction, were obtained without and
with intravenous contrast.
CONTRAST:  17mL MULTIHANCE GADOBENATE DIMEGLUMINE 529 MG/ML IV SOLN

[Series 13: T1 · sagittal · 3.0mm · 0.41mm/px · 4 of 15 slices shown (1 of 2)]
[im 1/15]
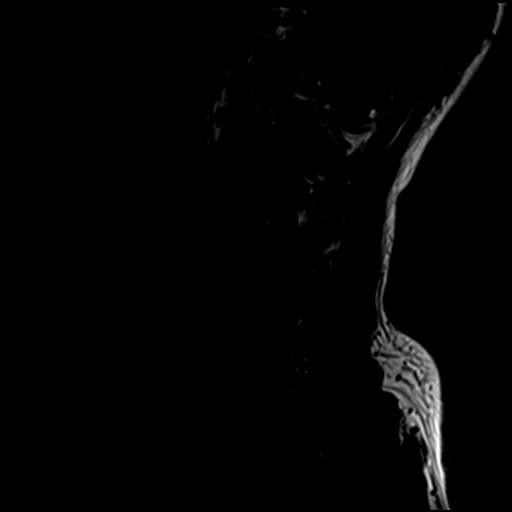
[im 5/15]
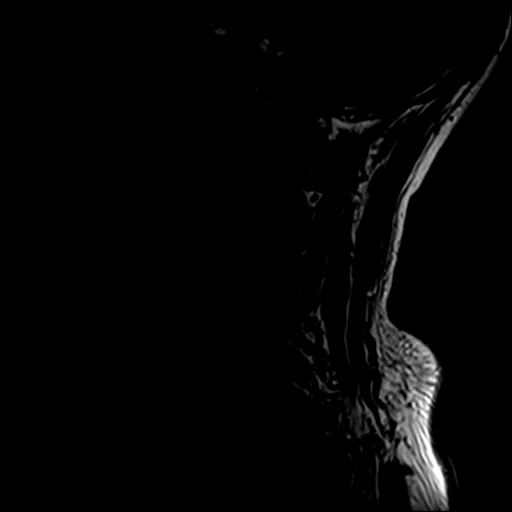
[im 10/15]
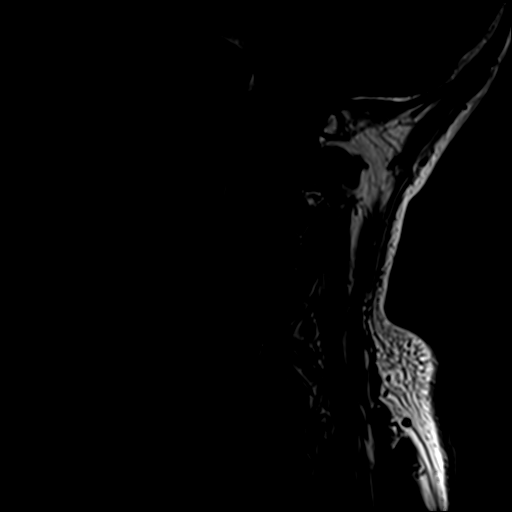
[im 15/15]
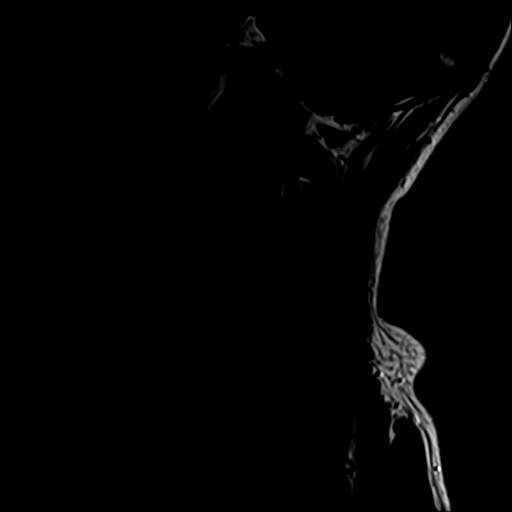

[Series 15: T2 · axial · 3.0mm · 0.70mm/px · z∈[-201,-90]mm · 8 of 30 slices shown (1 of 2)]
[im 1/30]
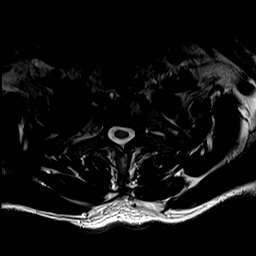
[im 5/30]
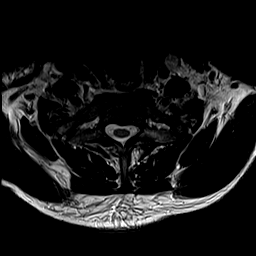
[im 9/30]
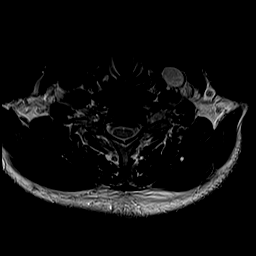
[im 13/30]
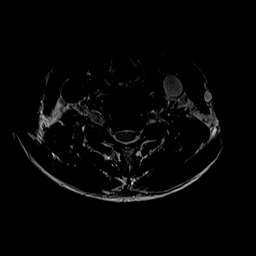
[im 17/30]
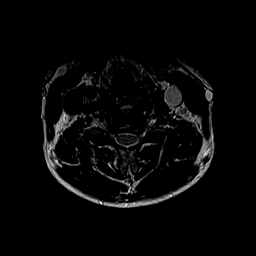
[im 21/30]
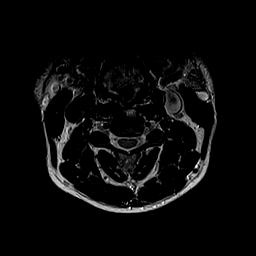
[im 25/30]
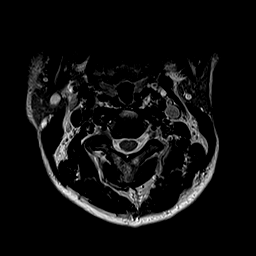
[im 30/30]
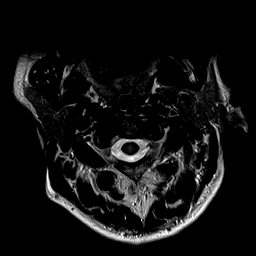

[Series 16: T1 · axial · 3.0mm · 0.35mm/px · z∈[-201,-109]mm · 6 of 30 slices shown (2 of 2)]
[im 1/30]
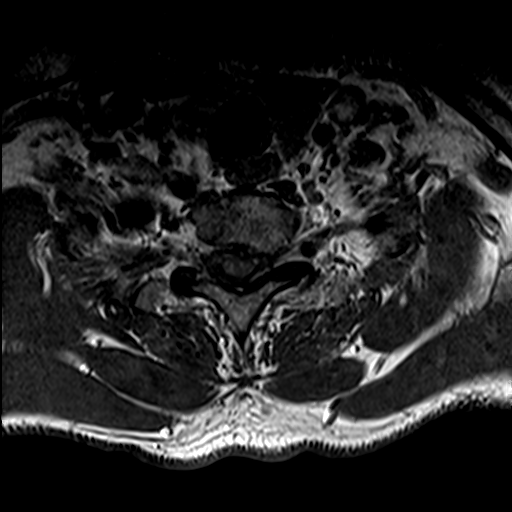
[im 5/30]
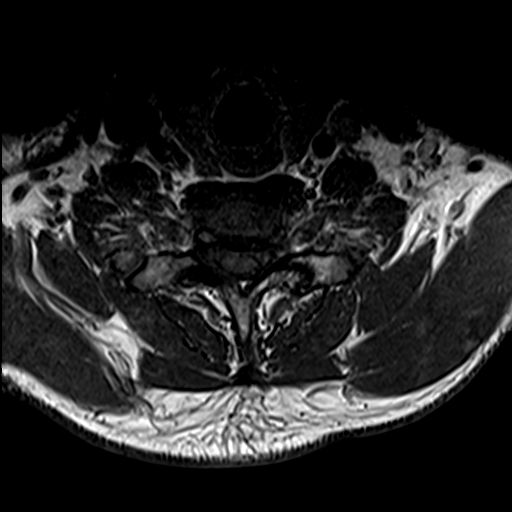
[im 9/30]
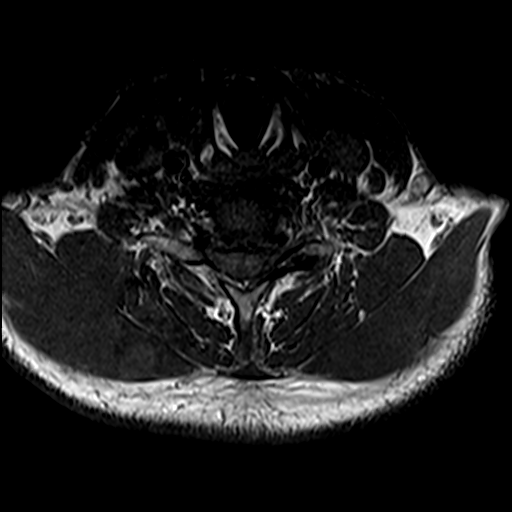
[im 13/30]
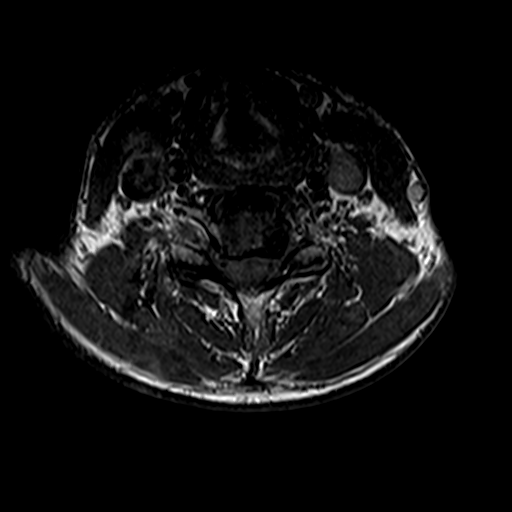
[im 17/30]
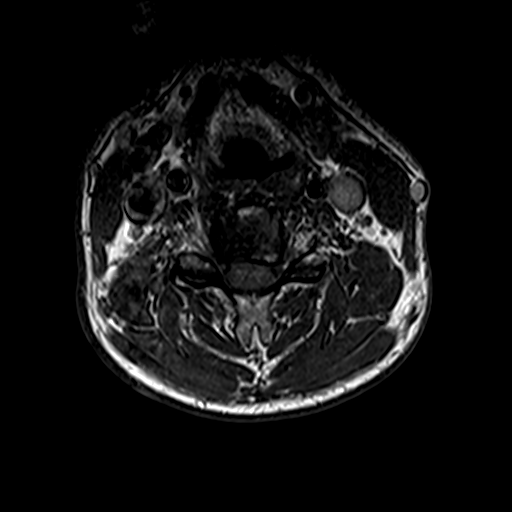
[im 25/30]
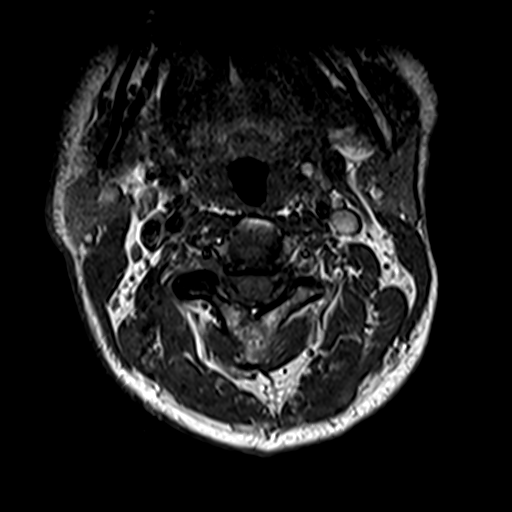

[Series 17: T2 · sagittal · 3.0mm · 0.41mm/px · 4 of 15 slices shown (2 of 2)]
[im 1/15]
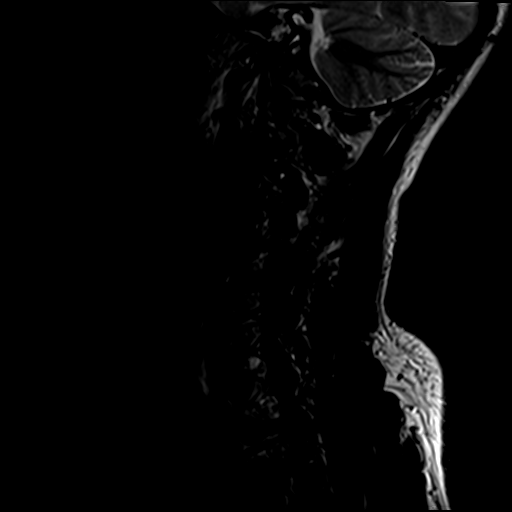
[im 5/15]
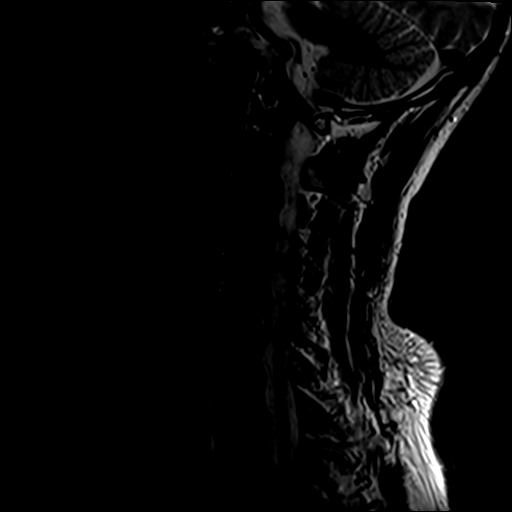
[im 10/15]
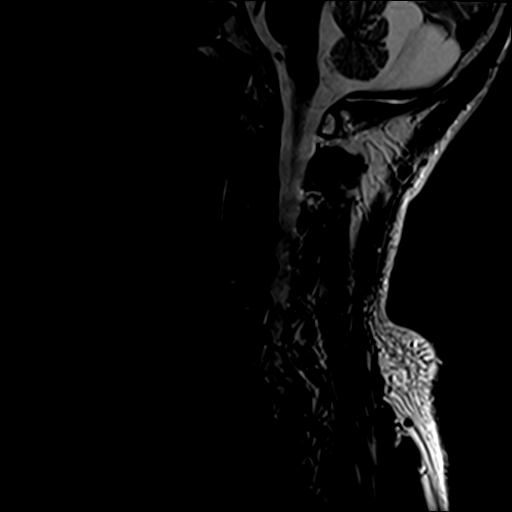
[im 15/15]
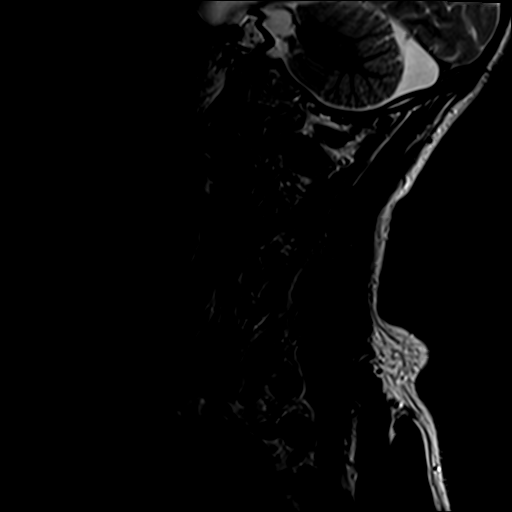

[22 of 48 positions shown; findings below may reference images not displayed]

FINDINGS: MRI HEAD FINDINGS

Brain: No acute infarct, intracranial hemorrhage, intra-axial mass,
or midline shift is identified. The ventricles are normal in size.
An 8.5 cm arachnoid cyst in the left middle cranial fossa is
unchanged with extension superiorly over the left lateral cerebral
convexity to the frontal operculum level. An enlarged CSF space in
the posterior midline of the posterior fossa is unchanged and may
represent a mega cisterna magna or arachnoid cyst.

Small foci of T2 hyperintensity are again seen in the juxtacortical,
deep, and periventricular white matter bilaterally and have mildly
progressed from the prior study. New lesions are annotated on series
10 and are most notable in the right lateral periventricular white
matter. No lesions demonstrate restricted diffusion or enhancement,
and no lesions are identified in the posterior fossa.

Vascular: Major intracranial vascular flow voids are preserved.

Skull and upper cervical spine: No suspicious marrow lesion.
Thinning of the calvarium overlying the left middle cranial fossa
arachnoid cyst.

Sinuses/Orbits: Unremarkable orbits. Mild mucosal thickening in the
ethmoid and maxillary sinuses. At most trace bilateral mastoid
fluid.

Other: None.

MRI CERVICAL SPINE FINDINGS

Alignment: Cervical spine straightening. New trace anterolisthesis
of C7 on T1.

Vertebrae: No fracture or suspicious marrow lesion. New moderate
degenerative endplate edema at C4-5 greater than C5-6. Moderate
right-sided facet edema at C2-3.

Cord: Small focus of faint T2 hyperintensity in the midline of the
spinal cord at C2, decreased in size and intensity from the prior
study. No new cord lesion or abnormal enhancement. Normal cord
volume.

Posterior Fossa, vertebral arteries, paraspinal tissues:
Unremarkable.

Disc levels:

C2-3: Disc bulging, uncovertebral spurring, and moderate to severe
right facet arthrosis result in new moderate right neural foraminal
stenosis without spinal stenosis.

C3-4: Broad-based posterior disc osteophyte complex results in mild
spinal stenosis without significant neural foraminal stenosis,
unchanged.

C4-5: Broad-based posterior disc osteophyte complex results in mild
spinal stenosis and moderate to severe right and moderate left
neural foraminal stenosis. Right neural foraminal stenosis is new.

C5-6: Broad-based posterior disc osteophyte complex results in
borderline spinal stenosis and moderate right and moderate to severe
left neural foraminal stenosis, mildly progressed.

C6-7: Broad-based posterior disc osteophyte complex results in mild
spinal stenosis and severe bilateral neural foraminal stenosis,
progressed.

C7-T1: New anterolisthesis with mild bulging of uncovered disc,
uncovertebral spurring, and moderate right and severe left facet
arthrosis result in borderline right neural foraminal stenosis
without spinal stenosis.

T1-2: Only imaged sagittally. A left central to left foraminal disc
osteophyte complex and mild left facet arthrosis result in mild
spinal stenosis and mild left neural foraminal stenosis, mildly
progressed from prior.
IMPRESSION: 1. Mild progression of cerebral white matter disease consistent with
multiple sclerosis. No evidence of active demyelination.
2. Decreased size and intensity of spinal cord lesion at C2. No
evidence of new cervical spinal cord involvement.
3. Progressive cervical disc and facet degeneration with mild
multilevel spinal stenosis and up to severe neural foraminal
stenosis as above.

## 2022-02-13 ENCOUNTER — Ambulatory Visit: Payer: BC Managed Care – PPO | Admitting: Neurology

## 2022-02-13 ENCOUNTER — Encounter: Payer: Self-pay | Admitting: Neurology

## 2022-02-13 VITALS — BP 152/103 | HR 85 | Ht 70.0 in | Wt 207.5 lb

## 2022-02-13 DIAGNOSIS — Z79899 Other long term (current) drug therapy: Secondary | ICD-10-CM | POA: Diagnosis not present

## 2022-02-13 DIAGNOSIS — F419 Anxiety disorder, unspecified: Secondary | ICD-10-CM

## 2022-02-13 DIAGNOSIS — G35 Multiple sclerosis: Secondary | ICD-10-CM

## 2022-02-13 DIAGNOSIS — H0520 Unspecified exophthalmos: Secondary | ICD-10-CM | POA: Diagnosis not present

## 2022-02-13 DIAGNOSIS — G35D Multiple sclerosis, unspecified: Secondary | ICD-10-CM

## 2022-02-13 DIAGNOSIS — G4700 Insomnia, unspecified: Secondary | ICD-10-CM

## 2022-02-13 MED ORDER — ALPRAZOLAM 0.5 MG PO TABS: ORAL_TABLET | ORAL | 5 refills | Status: DC

## 2022-02-13 NOTE — Progress Notes (Signed)
[facility]  PATIENT: Christopher Santana DOB: 1975/12/08  REFERRING DOCTOR OR PCP:   Seabron Spates DO SOURCE: Patient, notes from primary care, imaging and lab reports, MRI images personally reviewed.  _________________________________   HISTORICAL  CHIEF COMPLAINT:  Chief Complaint  Patient presents with   Follow-up    Pt in room #10 and alone. Pt here today for f/u on Mavenclad for her MS.    HISTORY OF PRESENT ILLNESS:  Christopher Santana is a 46 y.o. man with relapsing remitting MS.  Update 9/29//2022: He took [facility] for cycle starting Apr 30, 2020 and May 30, 2020 and he tolerated it very well.  He had a second cycle in March and April 2023 (was delayed due to insurance issues) he also tolerated that course very well.  He denies any recent exacerbations or new neurologic symptoms.  Lymphocyte count 01/04/2021 was normal at 1.0   Gait and balance are doing well.  No numbness or weakness.   Marland Kitchen    He also rarely takes for insomnia.        No difficulties with bladder.  Vision is fine.  He denies much fatigue but has some insomnia.    He denies depression.   He has some anxiety, helped by low dose of xanax.  He denies issues with cognition.     MS history: He was diagnosed with MS by Dr. Clarisse Gouge around 2008 when he presented with numbness in his entire body.    MRI showed a focus at C2 and some hemispheric foci consistent with MS.    He had a second opinion at [facility] Kathrynn Ducking) who concurred with the diagnosis.   He was placed on Avonex for just a year.  He then switched to Rebif and stopped around 2014.   He stopped by personal choice.   Over the next 2 years he had some fluctuating symptoms especially sensory but no major exacerbation.  In 2021 he had a small exacerbation with numbness.  Specifically there was tingling in the legs, right greater than left..  He started [facility] January 2022.  Images:  MRI of the brain from 08/31/2019 and compared it with the MRI from  08/18/2012.  The MRI shows a large arachnoid cyst in the left middle fossa.  There are more than a dozen T2/FLAIR hyperintense foci.  Some are in the periventricular white matter and others are in the juxtacortical white matter.  Between 2014 and 2021, about 6 new lesions have developed.    The MRI of the cervical spine also performed 08/31/2019 was compared to an MRI of the cervical spine from 2008.  Both show a focus adjacent to C2.  It is less pronounced on the current MRI than the previous MRI.  Additionally there are multilevel degenerative changes.   REVIEW OF SYSTEMS: Constitutional: No fevers, chills, sweats, or change in appetite Eyes: No visual changes, double vision, eye pain Ear, nose and throat: No hearing loss, ear pain, nasal congestion, sore throat Cardiovascular: No chest pain, palpitations Respiratory:  No shortness of breath at rest or with exertion.   No wheezes GastrointestinaI: No nausea, vomiting, diarrhea, abdominal pain, fecal incontinence Genitourinary:  No dysuria, urinary retention or frequency.  No nocturia. Musculoskeletal:  No neck pain, back pain Integumentary: No rash, pruritus, skin lesions Neurological: as above Psychiatric: No depression at this time.  some anxiety Endocrine: No palpitations, diaphoresis, change in appetite, change in weigh or increased thirst Hematologic/Lymphatic:  No anemia, purpura, petechiae. Allergic/Immunologic: No itchy/runny  eyes, nasal congestion, recent allergic reactions, rashes  ALLERGIES: No Known Allergies  HOME MEDICATIONS:  Current Outpatient Medications:    cetirizine (ZYRTEC) 10 MG tablet, Take 1 tablet (10 mg total) by mouth daily., Disp: 30 tablet, Rfl: 11   fluticasone (FLONASE) 50 MCG/ACT nasal spray, Place 2 sprays into both nostrils daily., Disp: 16 g, Rfl: 6   ketoconazole (NIZORAL) 200 MG tablet, As directed, Disp: 7 tablet, Rfl: 2   ALPRAZolam (XANAX) 0.5 MG tablet, TAKE 1 TABLET(0.5 MG) BY MOUTH DAILY AS  NEEDED FOR ANXIETY, Disp: 20 tablet, Rfl: 5  PAST MEDICAL HISTORY: History reviewed. No pertinent past medical history.  PAST SURGICAL HISTORY: Past Surgical History:  Procedure Laterality Date   CHOLECYSTECTOMY     KNEE SURGERY Left    around 2011    FAMILY HISTORY: History reviewed. No pertinent family history.  SOCIAL HISTORY:  Social History   Socioeconomic History   Marital status: Married    Spouse name: Porfirio Mylar   Number of children: 0   Years of education: Not on file   Highest education level: Not on file  Occupational History   Not on file  Tobacco Use   Smoking status: Never   Smokeless tobacco: Never  Substance and Sexual Activity   Alcohol use: Yes   Drug use: No   Sexual activity: Yes    Partners: Female  Other Topics Concern   Not on file  Social History Narrative   Right handed    Caffeine use: 3 cups per day   Social Determinants of Health   Financial Resource Strain: Not on file  Food Insecurity: Not on file  Transportation Needs: Not on file  Physical Activity: Not on file  Stress: Not on file  Social Connections: Not on file  Intimate Partner Violence: Not on file     PHYSICAL EXAM  Vitals:   02/13/22 1254  BP: (!) 152/103  Pulse: 85  Weight: 207 lb 8 oz (94.1 kg)  Height: 5\' 10"  (1.778 m)    Body mass index is 29.77 kg/m.  No results found.   General: The patient is well-developed and well-nourished and in no acute distress .  No rashes.    HEENT:  Head is [place]/AT.  Sclera are anicteric.    Neurologic Exam  Mental status: The patient is alert and oriented x 3 at the time of the examination. The patient has apparent normal recent and remote memory, with an apparently normal attention span and concentration ability.   Speech is normal.  Cranial nerves: Extraocular movements are full there is mild end gaze nystagmus.  There is slight eyelid retraction and lid lag..  Facial strength and sensation was normal.  Hearing was  normal.  Motor:  Muscle bulk is normal.   Tone is normal. Strength is  5 / 5 in all 4 extremities.   Sensory: Sensory testing is intact to pinprick, soft touch and vibration sensation in all 4 extremities.  Coordination: Cerebellar testing reveals good finger-nose-finger and heel-to-shin bilaterally.  Gait and station: Station is normal.  Gait and tandem gait are normal.  Romberg is negative..   Reflexes: Deep tendon reflexes are symmetric and normal bilaterally.        DIAGNOSTIC DATA (LABS, IMAGING, TESTING) - I reviewed patient records, labs, notes, testing and imaging myself where available.  Lab Results  Component Value Date   WBC 4.4 08/27/2021   HGB 13.5 08/27/2021   HCT 41.1 08/27/2021   MCV 81 08/27/2021  PLT 207 08/27/2021      Component Value Date/Time   NA 140 01/04/2021 1126   K 5.3 (H) 01/04/2021 1126   CL 103 01/04/2021 1126   CO2 23 01/04/2021 1126   GLUCOSE 93 01/04/2021 1126   GLUCOSE 82 07/08/2019 1343   BUN 26 (H) 01/04/2021 1126   CREATININE 1.00 01/04/2021 1126   CALCIUM 9.7 01/04/2021 1126   PROT 6.8 08/27/2021 1347   ALBUMIN 4.6 08/27/2021 1347   AST 47 (H) 08/27/2021 1347   ALT 49 (H) 08/27/2021 1347   ALKPHOS 60 08/27/2021 1347   BILITOT 0.6 08/27/2021 1347   GFRNONAA 94 09/28/2019 1431   GFRAA 109 09/28/2019 1431   Lab Results  Component Value Date   CHOL 131 01/04/2021   HDL 63 01/04/2021   LDLCALC 59 01/04/2021   TRIG 32 01/04/2021   CHOLHDL 2.1 01/04/2021   No results found for: "HGBA1C" Lab Results  Component Value Date   VITAMINB12 330 07/08/2019   Lab Results  Component Value Date   TSH 1.02 07/08/2019       ASSESSMENT AND PLAN  MS (multiple sclerosis) ([facility]) - Plan: MR BRAIN W WO CONTRAST, CBC with Differential/Platelet, Comprehensive metabolic panel, Thyroid Panel With TSH  High risk medication use - Plan: CBC with Differential/Platelet, Comprehensive metabolic panel, Thyroid Panel With TSH  Exophthalmos -  Plan: Thyroid Panel With TSH  Anxiety  Insomnia, unspecified type  1.   He completed the first and second years of Mavenclad and tolerated very well.  Lymphocyte counts were a little bit low at 0.6 when checked 2 to 3 months after his second year (as expected) we will recheck CBC and CMP today.  Additionally we will check an MRI of the brain   to determine if he is having subclinical progression.  We discussed that if he does have breakthrough activity we will need to consider adding a different disease modifying therapy. 2.    Continue as needed Xanax at bedtime (renew) 3.    Stay active and exercise as tolerated. 4.     Due to mild exophthalmos, I will check thyroid function as well.   5.   rtc 12 months or sooner if new or worsening issues   Ninamarie Keel A. Epimenio Foot, MD, [facility] 02/13/2022, 1:40 PM Certified in Neurology, Clinical Neurophysiology, Sleep Medicine and Neuroimaging  [facility] [address], [address] [place], [place] [address] [phone]

## 2022-02-14 LAB — CBC WITH DIFFERENTIAL/PLATELET
Basophils Absolute: 0 10*3/uL (ref 0.0–0.2)
Basos: 1 %
EOS (ABSOLUTE): 0.1 10*3/uL (ref 0.0–0.4)
Eos: 3 %
Hematocrit: 44.6 % (ref 37.5–51.0)
Hemoglobin: 14.7 g/dL (ref 13.0–17.7)
Immature Grans (Abs): 0 10*3/uL (ref 0.0–0.1)
Immature Granulocytes: 0 %
Lymphocytes Absolute: 0.9 10*3/uL (ref 0.7–3.1)
Lymphs: 25 %
MCH: 26.3 pg — ABNORMAL LOW (ref 26.6–33.0)
MCHC: 33 g/dL (ref 31.5–35.7)
MCV: 80 fL (ref 79–97)
Monocytes Absolute: 0.3 10*3/uL (ref 0.1–0.9)
Monocytes: 9 %
Neutrophils Absolute: 2.4 10*3/uL (ref 1.4–7.0)
Neutrophils: 62 %
Platelets: 220 10*3/uL (ref 150–450)
RBC: 5.58 x10E6/uL (ref 4.14–5.80)
RDW: 14.4 % (ref 11.6–15.4)
WBC: 3.8 10*3/uL (ref 3.4–10.8)

## 2022-02-14 LAB — COMPREHENSIVE METABOLIC PANEL
ALT: 43 IU/L (ref 0–44)
AST: 42 IU/L — ABNORMAL HIGH (ref 0–40)
Albumin/Globulin Ratio: 2.3 — ABNORMAL HIGH (ref 1.2–2.2)
Albumin: 4.9 g/dL (ref 4.1–5.1)
Alkaline Phosphatase: 53 IU/L (ref 44–121)
BUN/Creatinine Ratio: 15 (ref 9–20)
BUN: 17 mg/dL (ref 6–24)
Bilirubin Total: 0.3 mg/dL (ref 0.0–1.2)
CO2: 26 mmol/L (ref 20–29)
Calcium: 9.9 mg/dL (ref 8.7–10.2)
Chloride: 100 mmol/L (ref 96–106)
Creatinine, Ser: 1.13 mg/dL (ref 0.76–1.27)
Globulin, Total: 2.1 g/dL (ref 1.5–4.5)
Glucose: 86 mg/dL (ref 70–99)
Potassium: 4.8 mmol/L (ref 3.5–5.2)
Sodium: 138 mmol/L (ref 134–144)
Total Protein: 7 g/dL (ref 6.0–8.5)
eGFR: 80 mL/min/{1.73_m2} (ref >59)

## 2022-02-14 LAB — THYROID PANEL WITH TSH
Free Thyroxine Index: 1.5 (ref 1.2–4.9)
T3 Uptake Ratio: 27 % (ref 24–39)
T4, Total: 5.6 ug/dL (ref 4.5–12.0)
TSH: 1.41 u[IU]/mL (ref 0.450–4.500)

## 2022-04-19 ENCOUNTER — Encounter: Payer: Self-pay | Admitting: Family Medicine

## 2022-04-19 ENCOUNTER — Ambulatory Visit: Payer: BC Managed Care – PPO | Admitting: Family Medicine

## 2022-04-19 VITALS — BP 130/92 | HR 84 | Temp 97.9°F | Resp 18 | Ht 70.0 in | Wt 206.0 lb

## 2022-04-19 DIAGNOSIS — I10 Essential (primary) hypertension: Secondary | ICD-10-CM | POA: Diagnosis not present

## 2022-04-19 MED ORDER — LOSARTAN POTASSIUM 50 MG PO TABS: 50.0000 mg | ORAL_TABLET | Freq: Every day | ORAL | 1 refills | Status: DC

## 2022-04-19 NOTE — Progress Notes (Signed)
Subjective:   By signing my name below, I, Shehryar Baig, attest that this documentation has been prepared under the direction and in the presence of Ann Held, DO. 04/19/2022   Patient ID: Christopher Santana, male    DOB: 11-Aug-1975, 47 y.o.   MRN: [id number]  No chief complaint on file.   HPI Patient is in today for a follow-up visit.  His blood pressure is still running high. He reports that his high blood pressure is consistent. He continues with nitric oxide. BP Readings from Last 3 Encounters:  04/19/22 (!) 130/92  02/13/22 (!) 152/103  08/27/21 (!) 154/100   He recently had blood work and reports everything to be normal. He is interested in receiving a calcium score test.   No past medical history on file.  Past Surgical History:  Procedure Laterality Date   CHOLECYSTECTOMY     KNEE SURGERY Left    around 2011    No family history on file.  Social History   Socioeconomic History   Marital status: Married    Spouse name: Asencion Partridge   Number of children: 0   Years of education: Not on file   Highest education level: Not on file  Occupational History   Not on file  Tobacco Use   Smoking status: Never   Smokeless tobacco: Never  Substance and Sexual Activity   Alcohol use: Yes   Drug use: No   Sexual activity: Yes    Partners: Female  Other Topics Concern   Not on file  Social History Narrative   Right handed    Caffeine use: 3 cups per day   Social Determinants of Health   Financial Resource Strain: Not on file  Food Insecurity: Not on file  Transportation Needs: Not on file  Physical Activity: Not on file  Stress: Not on file  Social Connections: Not on file  Intimate Partner Violence: Not on file    Outpatient Medications Prior to Visit  Medication Sig Dispense Refill   ALPRAZolam (XANAX) 0.5 MG tablet TAKE 1 TABLET(0.5 MG) BY MOUTH DAILY AS NEEDED FOR ANXIETY 20 tablet 5   cetirizine (ZYRTEC) 10 MG tablet Take 1 tablet (10 mg total) by  mouth daily. 30 tablet 11   fluticasone (FLONASE) 50 MCG/ACT nasal spray Place 2 sprays into both nostrils daily. 16 g 6   ketoconazole (NIZORAL) 200 MG tablet As directed 7 tablet 2   No facility-administered medications prior to visit.    No Known Allergies  Review of Systems  Respiratory:  Negative for shortness of breath.   Cardiovascular:  Negative for chest pain.       Objective:    Physical Exam Constitutional:      General: He is not in acute distress.    Appearance: Normal appearance. He is not ill-appearing.  HENT:     Head: Normocephalic and atraumatic.     Right Ear: External ear normal.     Left Ear: External ear normal.  Eyes:     Extraocular Movements: Extraocular movements intact.     Pupils: Pupils are equal, round, and reactive to light.  Cardiovascular:     Rate and Rhythm: Normal rate and regular rhythm.     Heart sounds: Normal heart sounds. No murmur heard.    No gallop.     Comments: Blood pressure measured 160/110 during manual recheck.  Pulmonary:     Effort: Pulmonary effort is normal. No respiratory distress.     Breath sounds: Normal  breath sounds. No wheezing or rales.  Skin:    General: Skin is warm and dry.  Neurological:     Mental Status: He is alert and oriented to person, place, and time.  Psychiatric:        Judgment: Judgment normal.     There were no vitals taken for this visit. Wt Readings from Last 3 Encounters:  02/13/22 207 lb 8 oz (94.1 kg)  08/27/21 198 lb (89.8 kg)  01/04/21 186 lb (84.4 kg)       Assessment & Plan:  There are no diagnoses linked to this encounter.  I, Shehryar [place], personally preformed the services described in this documentation.  All medical record entries made by the scribe were at my direction and in my presence.  I have reviewed the chart and discharge instructions (if applicable) and agree that the record reflects my personal performance and is accurate and complete. 04/19/2022   I,Shehryar  Baig,acting as a scribe for Ann Held, DO.,have documented all relevant documentation on the behalf of Ann Held, DO,as directed by  Ann Held, DO while in the presence of Ann Held, DO.   Shehryar [employer]

## 2022-04-21 DIAGNOSIS — I10 Essential (primary) hypertension: Secondary | ICD-10-CM | POA: Insufficient documentation

## 2022-04-21 NOTE — Assessment & Plan Note (Signed)
Poorly controlled will alter medications, encouraged DASH diet, minimize caffeine and obtain adequate sleep. Report concerning symptoms and follow up as directed and as needed   Losartan 50 mg 1 po qd  F/u 2-3 weeks for bp check

## 2022-05-02 ENCOUNTER — Ambulatory Visit: Payer: BC Managed Care – PPO

## 2022-05-02 MED ORDER — LOSARTAN POTASSIUM 100 MG PO TABS: 100.0000 mg | ORAL_TABLET | Freq: Every day | ORAL | 0 refills | Status: DC

## 2022-05-02 NOTE — Progress Notes (Signed)
BP Readings from Last 3 Encounters:  04/19/22 (!) 130/92  02/13/22 (!) 152/103  08/27/21 (!) 154/100   Patient here for BP check today.  Patient is on Losartan  50 mg 1po qd. Bp today  136/90 with a pulse of 82. Recheck bp 134/92. Per Dr. Etter Sjogren, medication will be increased to 100 mg and follow up in one month.  Rx sent for 100 mg, patient will call for one month follow up nurse visit BP check.

## 2022-06-19 ENCOUNTER — Other Ambulatory Visit: Payer: Self-pay | Admitting: Family Medicine

## 2022-06-19 DIAGNOSIS — I10 Essential (primary) hypertension: Secondary | ICD-10-CM

## 2022-06-20 ENCOUNTER — Other Ambulatory Visit: Payer: Self-pay

## 2022-06-20 MED ORDER — LOSARTAN POTASSIUM 100 MG PO TABS: 100.0000 mg | ORAL_TABLET | Freq: Every day | ORAL | 1 refills | Status: DC

## 2022-08-02 ENCOUNTER — Other Ambulatory Visit: Payer: Self-pay | Admitting: Family Medicine

## 2022-12-05 ENCOUNTER — Other Ambulatory Visit: Payer: Self-pay | Admitting: Neurology

## 2022-12-05 NOTE — Telephone Encounter (Signed)
Last seen on 02/13/22 Follow up scheduled on  Last filled on 05/07/22 #20(20 tablets) Rx pending to be signed

## 2023-03-20 ENCOUNTER — Ambulatory Visit: Payer: BC Managed Care – PPO | Admitting: Neurology

## 2023-06-11 ENCOUNTER — Ambulatory Visit: Payer: BC Managed Care – PPO | Admitting: Neurology

## 2023-06-11 ENCOUNTER — Other Ambulatory Visit: Payer: Self-pay | Admitting: Neurology

## 2023-06-11 NOTE — Telephone Encounter (Signed)
 Last seen 02/13/22 and next f/u 07/09/23. Last refilled 03/31/23 #20.

## 2023-07-09 ENCOUNTER — Encounter: Payer: Self-pay | Admitting: Neurology

## 2023-07-09 ENCOUNTER — Ambulatory Visit (INDEPENDENT_AMBULATORY_CARE_PROVIDER_SITE_OTHER): Admitting: Neurology

## 2023-07-09 VITALS — BP 139/90 | HR 75 | Ht 70.0 in | Wt 202.5 lb

## 2023-07-09 DIAGNOSIS — G35 Multiple sclerosis: Secondary | ICD-10-CM | POA: Diagnosis not present

## 2023-07-09 DIAGNOSIS — Z79899 Other long term (current) drug therapy: Secondary | ICD-10-CM | POA: Diagnosis not present

## 2023-07-09 DIAGNOSIS — G5601 Carpal tunnel syndrome, right upper limb: Secondary | ICD-10-CM | POA: Diagnosis not present

## 2023-07-09 DIAGNOSIS — R200 Anesthesia of skin: Secondary | ICD-10-CM

## 2023-07-09 MED ORDER — DEXAMETHASONE SODIUM PHOSPHATE 4 MG/ML IJ SOLN
4.0000 mg | Freq: Once | INTRAMUSCULAR | Status: AC
  Administered 2023-07-09: 4 mg via INTRAVENOUS

## 2023-07-09 MED ORDER — LIDOCAINE HCL 2 % IJ SOLN: 1.0000 mL | Freq: Once | INTRAMUSCULAR | Status: AC

## 2023-07-09 NOTE — Progress Notes (Signed)
 [facility]  PATIENT: Christopher Santana DOB: 14-Nov-1975  REFERRING DOCTOR OR PCP:   Seabron Spates DO SOURCE: Patient, notes from primary care, imaging and lab reports, MRI images personally reviewed.  _________________________________   HISTORICAL  CHIEF COMPLAINT:  Chief Complaint  Patient presents with   Follow-up    Pt in room 10 alone. Here for MS follow up. Pt reports no concerns, reports doing well.     HISTORY OF PRESENT ILLNESS:  Christopher Santana is a 48 y.o. man with relapsing remitting MS.  Update 07/09/2023.: He reports his multiple sclerosis continues to do well.  He took [facility] for cycle starting Apr 30, 2020 and May 30, 2020 and he tolerated it very well.  He had a second cycle in March and April 2023 (was delayed due to insurance issues) and he also tolerated that course very well.  He denies any recent exacerbations or new neurologic symptoms.  Lymphocyte count has returned to normal.  Gait and balance are doing well.  No weakness    No difficulties with bladder.  Vision is fine.  He is noting some numbness in the right hand.  He wakes up at night at times due to painful tingling   A wrist splint has helped.   Exercises for PT have not helped   He denies much fatigue but has some insomnia.    He denies depression.   He has some anxiety, helped by low dose of xanax.  He denies issues with cognition.     MS history: He was diagnosed with MS by Dr. Clarisse Gouge around 2008 when he presented with numbness in his entire body.    MRI showed a focus at C2 and some hemispheric foci consistent with MS.    He had a second opinion at [facility] Kathrynn Ducking) who concurred with the diagnosis.   He was placed on Avonex for just a year.  He then switched to Rebif and stopped around 2014.   He stopped by personal choice.   Over the next 2 years he had some fluctuating symptoms especially sensory but no major exacerbation.  In 2021 he had a small exacerbation with numbness.   Specifically there was tingling in the legs, right greater than left..  He started [facility] January 2022 and did second year in 2023  Images:  MRI of the brain from 08/31/2019 and compared it with the MRI from 08/18/2012.  The MRI shows a large arachnoid cyst in the left middle fossa.  There are more than a dozen T2/FLAIR hyperintense foci.  Some are in the periventricular white matter and others are in the juxtacortical white matter.  Between 2014 and 2021, about 6 new lesions have developed.    The MRI of the cervical spine also performed 08/31/2019 was compared to an MRI of the cervical spine from 2008.  Both show a focus adjacent to C2.  It is less pronounced on the current MRI than the previous MRI.  Additionally there are multilevel degenerative changes.   REVIEW OF SYSTEMS: Constitutional: No fevers, chills, sweats, or change in appetite Eyes: No visual changes, double vision, eye pain Ear, nose and throat: No hearing loss, ear pain, nasal congestion, sore throat Cardiovascular: No chest pain, palpitations Respiratory:  No shortness of breath at rest or with exertion.   No wheezes GastrointestinaI: No nausea, vomiting, diarrhea, abdominal pain, fecal incontinence Genitourinary:  No dysuria, urinary retention or frequency.  No nocturia. Musculoskeletal:  No neck pain, back pain Integumentary: No rash,  pruritus, skin lesions Neurological: as above Psychiatric: No depression at this time.  some anxiety Endocrine: No palpitations, diaphoresis, change in appetite, change in weigh or increased thirst Hematologic/Lymphatic:  No anemia, purpura, petechiae. Allergic/Immunologic: No itchy/runny eyes, nasal congestion, recent allergic reactions, rashes  ALLERGIES: No Known Allergies  HOME MEDICATIONS:  Current Outpatient Medications:    ALPRAZolam (XANAX) 0.5 MG tablet, TAKE 1 TABLET(0.5 MG) BY MOUTH DAILY AS NEEDED FOR ANXIETY, Disp: 20 tablet, Rfl: 3   cetirizine (ZYRTEC) 10 MG tablet,  Take 1 tablet (10 mg total) by mouth daily., Disp: 30 tablet, Rfl: 11   fluticasone (FLONASE) 50 MCG/ACT nasal spray, Place 2 sprays into both nostrils daily., Disp: 16 g, Rfl: 6   ketoconazole (NIZORAL) 200 MG tablet, As directed, Disp: 7 tablet, Rfl: 2   losartan (COZAAR) 100 MG tablet, TAKE 1 TABLET(100 MG) BY MOUTH DAILY, Disp: 90 tablet, Rfl: 1  Current Facility-Administered Medications:    lidocaine (XYLOCAINE) 2 % (with pres) injection 20 mg, 1 mL, Intradermal, Once, Jemimah Cressy, Pearletha Furl, MD  PAST MEDICAL HISTORY: History reviewed. No pertinent past medical history.  PAST SURGICAL HISTORY: Past Surgical History:  Procedure Laterality Date   CHOLECYSTECTOMY     KNEE SURGERY Left    around 2011    FAMILY HISTORY: History reviewed. No pertinent family history.  SOCIAL HISTORY:  Social History   Socioeconomic History   Marital status: Married    Spouse name: Porfirio Mylar   Number of children: 0   Years of education: Not on file   Highest education level: Not on file  Occupational History   Not on file  Tobacco Use   Smoking status: Never   Smokeless tobacco: Never  Vaping Use   Vaping status: Never Used  Substance and Sexual Activity   Alcohol use: Yes   Drug use: No   Sexual activity: Yes    Partners: Female  Other Topics Concern   Not on file  Social History Narrative   Right handed    Caffeine use: 3 cups per day   Social Drivers of Health   Financial Resource Strain: Not on file  Food Insecurity: Not on file  Transportation Needs: Not on file  Physical Activity: Not on file  Stress: Not on file  Social Connections: Unknown (08/20/2021)   Received from [facility]   Social Network    Social Network: Not on file  Intimate Partner Violence: Unknown (07/12/2021)   Received from [facility], [facility]   HITS    Physically Hurt: Not on file    Insult or Talk Down To: Not on file    Threaten Physical Harm: Not on file    Scream or Curse:  Not on file     PHYSICAL EXAM  Vitals:   07/09/23 1127  BP: (!) 139/90  Pulse: 75  Weight: 202 lb 8 oz (91.9 kg)  Height: 5\' 10"  (1.778 m)    Body mass index is 29.06 kg/m.  No results found.   General: The patient is well-developed and well-nourished and in no acute distress .  No rashes.    HEENT:  Head is [place]/AT.  Sclera are anicteric.    Neurologic Exam  Mental status: The patient is alert and oriented x 3 at the time of the examination. The patient has apparent normal recent and remote memory, with an apparently normal attention span and concentration ability.   Speech is normal.  Cranial nerves: Extraocular movements are full there is mild end  gaze nystagmus.  There is slight eyelid retraction and lid lag..  Facial strength and sensation was normal.  Hearing was normal.  Motor:  Muscle bulk is normal.   Tone is normal. Strength is  5 / 5 in all 4 extremities except 4+/5 in the right APB muscle.   Sensory: He has a Tinel's sign at the right wrist.  There is no Phalen sign.  Sensory testing is intact to pinprick, soft touch and vibration sensation in all 4 extremities.  Coordination: Cerebellar testing reveals good finger-nose-finger and heel-to-shin bilaterally.  Gait and station: Station is normal.  Gait and tandem gait are normal.  Romberg is negative..   Reflexes: Deep tendon reflexes are symmetric and normal bilaterally.        DIAGNOSTIC DATA (LABS, IMAGING, TESTING) - I reviewed patient records, labs, notes, testing and imaging myself where available.  Lab Results  Component Value Date   WBC 3.8 02/13/2022   HGB 14.7 02/13/2022   HCT 44.6 02/13/2022   MCV 80 02/13/2022   PLT 220 02/13/2022      Component Value Date/Time   NA 138 02/13/2022 1334   K 4.8 02/13/2022 1334   CL 100 02/13/2022 1334   CO2 26 02/13/2022 1334   GLUCOSE 86 02/13/2022 1334   GLUCOSE 82 07/08/2019 1343   BUN 17 02/13/2022 1334   CREATININE 1.13 02/13/2022 1334   CALCIUM  9.9 02/13/2022 1334   PROT 7.0 02/13/2022 1334   ALBUMIN 4.9 02/13/2022 1334   AST 42 (H) 02/13/2022 1334   ALT 43 02/13/2022 1334   ALKPHOS 53 02/13/2022 1334   BILITOT 0.3 02/13/2022 1334   GFRNONAA 94 09/28/2019 1431   GFRAA 109 09/28/2019 1431   Lab Results  Component Value Date   CHOL 131 01/04/2021   HDL 63 01/04/2021   LDLCALC 59 01/04/2021   TRIG 32 01/04/2021   CHOLHDL 2.1 01/04/2021   No results found for: "HGBA1C" Lab Results  Component Value Date   VITAMINB12 330 07/08/2019   Lab Results  Component Value Date   TSH 1.410 02/13/2022       ASSESSMENT AND PLAN  MS (multiple sclerosis) (HCC) - Plan: MR BRAIN W WO CONTRAST, dexamethasone (DECADRON) injection 4 mg, lidocaine (XYLOCAINE) 2 % (with pres) injection 20 mg  High risk medication use  Numbness - Plan: MR BRAIN W WO CONTRAST  Right carpal tunnel syndrome  1.   He has completed both years of [place] and lymphocyte counts have returned to normal..  We will check an MRI of the brain  to determine if he is having subclinical progression.  We discussed that if he does have breakthrough activity we will need to consider adding a different disease modifying therapy. 2.    Continue as needed Xanax at bedtime  3.    Stay active and exercise as tolerated. 4.     Inject right carpal tunnel with 2 mg Decadron in lidocaine using sterile technique.  He tolerated the procedure well and there were no complications. 5.   rtc 12 months or sooner if new or worsening issues   Zeanna Sunde A. Epimenio Foot, MD, [facility] 07/09/2023, 7:23 PM Certified in Neurology, Clinical Neurophysiology, Sleep Medicine and Neuroimaging  [facility] [address] [place], [place] [address] [phone]

## 2023-07-21 ENCOUNTER — Telehealth: Payer: Self-pay | Admitting: Neurology

## 2023-07-21 NOTE — Telephone Encounter (Signed)
 We left him a voice mail on 4/14 and 4/10 to schedule the MRI.

## 2023-10-01 ENCOUNTER — Other Ambulatory Visit: Payer: Self-pay | Admitting: Family Medicine

## 2023-10-01 DIAGNOSIS — I10 Essential (primary) hypertension: Secondary | ICD-10-CM

## 2023-10-01 MED ORDER — LOSARTAN POTASSIUM 100 MG PO TABS: 100.0000 mg | ORAL_TABLET | Freq: Every day | ORAL | 1 refills | Status: DC

## 2024-01-02 ENCOUNTER — Other Ambulatory Visit: Payer: Self-pay | Admitting: Neurology

## 2024-01-02 NOTE — Telephone Encounter (Signed)
 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   Dispensed Days Supply Quantity Provider Pharmacy  ALPRAZOLAM  0.5MG  TABLETS 12/10/2023 20 20 each Sater, Charlie LABOR, MD [facility] #...     Rx pending

## 2024-04-01 ENCOUNTER — Other Ambulatory Visit: Payer: Self-pay | Admitting: Family Medicine

## 2024-04-01 DIAGNOSIS — I10 Essential (primary) hypertension: Secondary | ICD-10-CM

## 2024-04-19 ENCOUNTER — Other Ambulatory Visit: Payer: Self-pay | Admitting: Family Medicine

## 2024-04-19 DIAGNOSIS — I10 Essential (primary) hypertension: Secondary | ICD-10-CM

## 2024-04-19 MED ORDER — LOSARTAN POTASSIUM 100 MG PO TABS: 100.0000 mg | ORAL_TABLET | Freq: Every day | ORAL | 3 refills | Status: AC

## 2024-04-21 ENCOUNTER — Other Ambulatory Visit: Payer: Self-pay | Admitting: Neurology

## 2024-04-22 NOTE — Telephone Encounter (Signed)
 Last seen on 07/09/23 No follow up scheduled    Dispensed Days Supply Quantity Provider Pharmacy  ALPRAZOLAM  0.5MG  TABLETS 01/02/2024 20 20 each Onita Duos, MD [facility] DR[facility]ORE #...     Rx pending to be signed
